# Patient Record
Sex: Female | Born: 1945 | Race: White | Hispanic: No | Smoking: Never smoker
Health system: Southern US, Community
[De-identification: ages and names within clinical notes are randomized; demographics above are authoritative.]

## PROBLEM LIST (undated history)

## (undated) DIAGNOSIS — M810 Age-related osteoporosis without current pathological fracture: Secondary | ICD-10-CM

## (undated) DIAGNOSIS — E785 Hyperlipidemia, unspecified: Secondary | ICD-10-CM

## (undated) DIAGNOSIS — J449 Chronic obstructive pulmonary disease, unspecified: Secondary | ICD-10-CM

## (undated) DIAGNOSIS — J309 Allergic rhinitis, unspecified: Secondary | ICD-10-CM

## (undated) DIAGNOSIS — A809 Acute poliomyelitis, unspecified: Secondary | ICD-10-CM

## (undated) DIAGNOSIS — K759 Inflammatory liver disease, unspecified: Secondary | ICD-10-CM

## (undated) DIAGNOSIS — J45909 Unspecified asthma, uncomplicated: Secondary | ICD-10-CM

## (undated) DIAGNOSIS — K5790 Diverticulosis of intestine, part unspecified, without perforation or abscess without bleeding: Secondary | ICD-10-CM

## (undated) DIAGNOSIS — B91 Sequelae of poliomyelitis: Secondary | ICD-10-CM

## (undated) DIAGNOSIS — I10 Essential (primary) hypertension: Secondary | ICD-10-CM

## (undated) DIAGNOSIS — J189 Pneumonia, unspecified organism: Secondary | ICD-10-CM

## (undated) DIAGNOSIS — L570 Actinic keratosis: Secondary | ICD-10-CM

## (undated) DIAGNOSIS — M1990 Unspecified osteoarthritis, unspecified site: Secondary | ICD-10-CM

## (undated) DIAGNOSIS — Z86010 Personal history of colonic polyps: Secondary | ICD-10-CM

## (undated) HISTORY — PX: NASAL SINUS SURGERY: SHX719

## (undated) HISTORY — DX: Actinic keratosis: L57.0

## (undated) HISTORY — PX: FRACTURE SURGERY: SHX138

## (undated) HISTORY — PX: ABDOMINAL HYSTERECTOMY: SHX81

## (undated) HISTORY — PX: COLONOSCOPY: SHX174

## (undated) HISTORY — PX: EYE SURGERY: SHX253

## (undated) HISTORY — PX: BONE CURRETTAGE W/ BONE GRAFT: SHX1248

---

## 1972-01-13 HISTORY — PX: OTHER SURGICAL HISTORY: SHX169

## 2003-11-22 ENCOUNTER — Ambulatory Visit: Payer: Self-pay | Admitting: Unknown Physician Specialty

## 2004-10-02 ENCOUNTER — Ambulatory Visit: Payer: Self-pay | Admitting: Internal Medicine

## 2005-10-05 ENCOUNTER — Ambulatory Visit: Payer: Self-pay | Admitting: Internal Medicine

## 2006-10-27 ENCOUNTER — Ambulatory Visit: Payer: Self-pay | Admitting: Internal Medicine

## 2007-07-20 ENCOUNTER — Ambulatory Visit: Payer: Self-pay | Admitting: Specialist

## 2007-10-31 ENCOUNTER — Ambulatory Visit: Payer: Self-pay | Admitting: Internal Medicine

## 2007-11-03 ENCOUNTER — Ambulatory Visit: Payer: Self-pay | Admitting: Unknown Physician Specialty

## 2008-07-18 ENCOUNTER — Ambulatory Visit: Payer: Self-pay | Admitting: Family Medicine

## 2008-11-12 ENCOUNTER — Ambulatory Visit: Payer: Self-pay | Admitting: Internal Medicine

## 2009-11-13 ENCOUNTER — Ambulatory Visit: Payer: Self-pay | Admitting: Internal Medicine

## 2010-11-17 ENCOUNTER — Ambulatory Visit: Payer: Self-pay | Admitting: Internal Medicine

## 2011-11-18 ENCOUNTER — Ambulatory Visit: Payer: Self-pay

## 2012-11-21 ENCOUNTER — Ambulatory Visit: Payer: Self-pay

## 2012-12-07 ENCOUNTER — Ambulatory Visit: Payer: Self-pay | Admitting: Unknown Physician Specialty

## 2012-12-09 LAB — PATHOLOGY REPORT

## 2013-04-24 ENCOUNTER — Encounter: Payer: Self-pay | Admitting: Nurse Practitioner

## 2013-05-12 ENCOUNTER — Encounter: Payer: Self-pay | Admitting: Nurse Practitioner

## 2013-12-06 ENCOUNTER — Ambulatory Visit: Payer: Self-pay

## 2014-11-22 ENCOUNTER — Other Ambulatory Visit: Payer: Self-pay | Admitting: Infectious Diseases

## 2014-11-22 DIAGNOSIS — Z1231 Encounter for screening mammogram for malignant neoplasm of breast: Secondary | ICD-10-CM

## 2014-12-11 ENCOUNTER — Other Ambulatory Visit: Payer: Self-pay | Admitting: Infectious Diseases

## 2014-12-11 ENCOUNTER — Ambulatory Visit
Admission: RE | Admit: 2014-12-11 | Discharge: 2014-12-11 | Disposition: A | Discharge status: Home / Self Care | Payer: Medicare Other | Source: Ambulatory Visit | Attending: Infectious Diseases | Admitting: Infectious Diseases

## 2014-12-11 DIAGNOSIS — Z1231 Encounter for screening mammogram for malignant neoplasm of breast: Secondary | ICD-10-CM

## 2015-11-21 ENCOUNTER — Other Ambulatory Visit: Payer: Self-pay | Admitting: Infectious Diseases

## 2015-11-21 DIAGNOSIS — Z1231 Encounter for screening mammogram for malignant neoplasm of breast: Secondary | ICD-10-CM

## 2015-12-25 ENCOUNTER — Ambulatory Visit
Admission: RE | Admit: 2015-12-25 | Discharge: 2015-12-25 | Disposition: A | Discharge status: Home / Self Care | Payer: Medicare Other | Source: Ambulatory Visit | Attending: Infectious Diseases | Admitting: Infectious Diseases

## 2015-12-25 DIAGNOSIS — Z1231 Encounter for screening mammogram for malignant neoplasm of breast: Secondary | ICD-10-CM | POA: Diagnosis present

## 2017-02-03 ENCOUNTER — Other Ambulatory Visit: Payer: Self-pay | Admitting: Infectious Diseases

## 2017-02-03 DIAGNOSIS — Z1231 Encounter for screening mammogram for malignant neoplasm of breast: Secondary | ICD-10-CM

## 2017-02-25 ENCOUNTER — Ambulatory Visit
Admission: RE | Admit: 2017-02-25 | Discharge: 2017-02-25 | Disposition: A | Discharge status: Home / Self Care | Payer: Medicare Other | Source: Ambulatory Visit | Attending: Infectious Diseases | Admitting: Infectious Diseases

## 2017-02-25 DIAGNOSIS — Z1231 Encounter for screening mammogram for malignant neoplasm of breast: Secondary | ICD-10-CM | POA: Diagnosis present

## 2018-11-08 ENCOUNTER — Other Ambulatory Visit: Payer: Self-pay | Admitting: Internal Medicine

## 2018-11-08 DIAGNOSIS — Z1231 Encounter for screening mammogram for malignant neoplasm of breast: Secondary | ICD-10-CM

## 2019-01-26 ENCOUNTER — Ambulatory Visit
Admission: RE | Admit: 2019-01-26 | Discharge: 2019-01-26 | Disposition: A | Discharge status: Home / Self Care | Payer: Medicare Other | Source: Ambulatory Visit | Attending: Internal Medicine | Admitting: Internal Medicine

## 2019-01-26 DIAGNOSIS — Z1231 Encounter for screening mammogram for malignant neoplasm of breast: Secondary | ICD-10-CM | POA: Diagnosis present

## 2019-03-23 ENCOUNTER — Other Ambulatory Visit
Admission: RE | Admit: 2019-03-23 | Discharge: 2019-03-23 | Disposition: A | Discharge status: Home / Self Care | Payer: Medicare Other | Source: Ambulatory Visit | Attending: Internal Medicine | Admitting: Internal Medicine

## 2019-03-23 ENCOUNTER — Other Ambulatory Visit: Payer: Self-pay

## 2019-03-23 DIAGNOSIS — Z20822 Contact with and (suspected) exposure to covid-19: Secondary | ICD-10-CM | POA: Diagnosis not present

## 2019-03-23 DIAGNOSIS — Z01812 Encounter for preprocedural laboratory examination: Secondary | ICD-10-CM | POA: Insufficient documentation

## 2019-03-23 LAB — SARS CORONAVIRUS 2 (TAT 6-24 HRS): SARS Coronavirus 2: NEGATIVE

## 2019-03-24 ENCOUNTER — Encounter: Payer: Self-pay | Admitting: Internal Medicine

## 2019-03-27 ENCOUNTER — Encounter: Payer: Self-pay | Admitting: Internal Medicine

## 2019-03-27 ENCOUNTER — Ambulatory Visit
Admission: RE | Admit: 2019-03-27 | Discharge: 2019-03-27 | Disposition: A | Discharge status: Home / Self Care | Payer: Medicare Other | Attending: Internal Medicine | Admitting: Internal Medicine

## 2019-03-27 ENCOUNTER — Ambulatory Visit: Payer: Medicare Other | Admitting: Certified Registered"

## 2019-03-27 ENCOUNTER — Encounter
Admission: RE | Disposition: A | Discharge status: Home / Self Care | Payer: Self-pay | Source: Home / Self Care | Attending: Internal Medicine

## 2019-03-27 DIAGNOSIS — Z79899 Other long term (current) drug therapy: Secondary | ICD-10-CM | POA: Insufficient documentation

## 2019-03-27 DIAGNOSIS — M1990 Unspecified osteoarthritis, unspecified site: Secondary | ICD-10-CM | POA: Diagnosis not present

## 2019-03-27 DIAGNOSIS — D122 Benign neoplasm of ascending colon: Secondary | ICD-10-CM | POA: Diagnosis not present

## 2019-03-27 DIAGNOSIS — M810 Age-related osteoporosis without current pathological fracture: Secondary | ICD-10-CM | POA: Diagnosis not present

## 2019-03-27 DIAGNOSIS — Z7982 Long term (current) use of aspirin: Secondary | ICD-10-CM | POA: Insufficient documentation

## 2019-03-27 DIAGNOSIS — K640 First degree hemorrhoids: Secondary | ICD-10-CM | POA: Diagnosis not present

## 2019-03-27 DIAGNOSIS — J449 Chronic obstructive pulmonary disease, unspecified: Secondary | ICD-10-CM | POA: Insufficient documentation

## 2019-03-27 DIAGNOSIS — I10 Essential (primary) hypertension: Secondary | ICD-10-CM | POA: Diagnosis not present

## 2019-03-27 DIAGNOSIS — E785 Hyperlipidemia, unspecified: Secondary | ICD-10-CM | POA: Insufficient documentation

## 2019-03-27 DIAGNOSIS — Z1211 Encounter for screening for malignant neoplasm of colon: Secondary | ICD-10-CM | POA: Insufficient documentation

## 2019-03-27 DIAGNOSIS — Z7951 Long term (current) use of inhaled steroids: Secondary | ICD-10-CM | POA: Diagnosis not present

## 2019-03-27 DIAGNOSIS — Z8612 Personal history of poliomyelitis: Secondary | ICD-10-CM | POA: Diagnosis not present

## 2019-03-27 DIAGNOSIS — K5730 Diverticulosis of large intestine without perforation or abscess without bleeding: Secondary | ICD-10-CM | POA: Insufficient documentation

## 2019-03-27 DIAGNOSIS — Z86010 Personal history of colonic polyps: Secondary | ICD-10-CM | POA: Insufficient documentation

## 2019-03-27 HISTORY — DX: Personal history of colonic polyps: Z86.010

## 2019-03-27 HISTORY — DX: Acute poliomyelitis, unspecified: A80.9

## 2019-03-27 HISTORY — DX: Sequelae of poliomyelitis: B91

## 2019-03-27 HISTORY — DX: Allergic rhinitis, unspecified: J30.9

## 2019-03-27 HISTORY — DX: Pneumonia, unspecified organism: J18.9

## 2019-03-27 HISTORY — DX: Essential (primary) hypertension: I10

## 2019-03-27 HISTORY — DX: Inflammatory liver disease, unspecified: K75.9

## 2019-03-27 HISTORY — DX: Unspecified asthma, uncomplicated: J45.909

## 2019-03-27 HISTORY — PX: COLONOSCOPY WITH PROPOFOL: SHX5780

## 2019-03-27 HISTORY — DX: Hyperlipidemia, unspecified: E78.5

## 2019-03-27 HISTORY — DX: Chronic obstructive pulmonary disease, unspecified: J44.9

## 2019-03-27 HISTORY — DX: Diverticulosis of intestine, part unspecified, without perforation or abscess without bleeding: K57.90

## 2019-03-27 HISTORY — DX: Age-related osteoporosis without current pathological fracture: M81.0

## 2019-03-27 HISTORY — DX: Unspecified osteoarthritis, unspecified site: M19.90

## 2019-03-27 SURGERY — COLONOSCOPY WITH PROPOFOL
Anesthesia: General

## 2019-03-27 MED ORDER — PROPOFOL 10 MG/ML IV BOLUS
INTRAVENOUS | Status: AC
  Filled 2019-03-27: qty 20

## 2019-03-27 MED ORDER — SODIUM CHLORIDE (PF) 0.9 % IJ SOLN
INTRAMUSCULAR | Status: DC | PRN
  Administered 2019-03-27: 5 mL

## 2019-03-27 MED ORDER — SODIUM CHLORIDE 0.9 % IV SOLN: INTRAVENOUS | Status: DC

## 2019-03-27 MED ORDER — LIDOCAINE HCL (CARDIAC) PF 100 MG/5ML IV SOSY
PREFILLED_SYRINGE | INTRAVENOUS | Status: DC | PRN
  Administered 2019-03-27: 40 mg via INTRAVENOUS

## 2019-03-27 MED ORDER — PROPOFOL 500 MG/50ML IV EMUL
INTRAVENOUS | Status: DC | PRN
  Administered 2019-03-27: 70 mg via INTRAVENOUS
  Administered 2019-03-27: 125 ug/kg/min via INTRAVENOUS

## 2019-03-27 NOTE — Op Note (Signed)
[facility] Gastroenterology Patient Name: Cheyenne Johnson Procedure Date: 03/27/2019 12:53 PM MRN: [id number] Account #: [id number] Date of Birth: 1945-05-31 Admit Type: Outpatient Age: 74 Room: [facility] ENDO ROOM 3 Gender: Female Note Status: Finalized Procedure:             Colonoscopy Indications:           Surveillance: Personal history of adenomatous polyps                         on last colonoscopy 5 years ago Providers:             Lorie Apley K. Alice Reichert MD, MD Referring MD:          Ramonita Lab, MD (Referring MD) Medicines:             Propofol per Anesthesia Complications:         No immediate complications. Procedure:             Pre-Anesthesia Assessment:                        - The risks and benefits of the procedure and the                         sedation options and risks were discussed with the                         patient. All questions were answered and informed                         consent was obtained.                        - Patient identification and proposed procedure were                         verified prior to the procedure by the nurse. The                         procedure was verified in the procedure room.                        - ASA Grade Assessment: III - A patient with severe                         systemic disease.                        - After reviewing the risks and benefits, the patient                         was deemed in satisfactory condition to undergo the                         procedure.                        After obtaining informed consent, the colonoscope was                         passed under direct vision. Throughout  the procedure,                         the patient's blood pressure, pulse, and oxygen                         saturations were monitored continuously. The                         Colonoscope was introduced through the anus and                         advanced to the the cecum, identified by  appendiceal                         orifice and ileocecal valve. The colonoscopy was                         performed without difficulty. The patient tolerated                         the procedure well. The quality of the bowel                         preparation was good. The ileocecal valve, appendiceal                         orifice, and rectum were photographed. Findings:      The perianal and digital rectal examinations were normal. Pertinent       negatives include normal sphincter tone and no palpable rectal lesions.      Many small-mouthed diverticula were found in the sigmoid colon.      A 12 mm polyp was found in the ascending colon. The polyp was       carpet-like. The polyp was removed with a saline injection-lift       technique using a hot snare. Resection and retrieval were complete. To       prevent bleeding after the polypectomy, one hemostatic clip was       successfully placed (MR conditional). There was no bleeding during, or       at the end, of the procedure.      Non-bleeding internal hemorrhoids were found during retroflexion. The       hemorrhoids were Grade I (internal hemorrhoids that do not prolapse).      The exam was otherwise without abnormality. Impression:            - Diverticulosis in the sigmoid colon.                        - One 12 mm polyp in the ascending colon, removed                         using injection-lift and a hot snare. Resected and                         retrieved. Clip (MR conditional) was placed.                        - Non-bleeding internal hemorrhoids.                        -  The examination was otherwise normal. Recommendation:        - Patient has a contact number available for                         emergencies. The signs and symptoms of potential                         delayed complications were discussed with the patient.                         Return to normal activities tomorrow. Written                          discharge instructions were provided to the patient.                        - Resume previous diet.                        - Continue present medications.                        - Await pathology results.                        - If polyps are benign or adenomatous without                         dysplasia, I will advise NO further colonoscopy due to                         advanced age and/or severe comorbidity.                        - Return to GI office PRN.                        - The findings and recommendations were discussed with                         the patient. Procedure Code(s):     --- Professional ---                        [phone], Colonoscopy, flexible; with removal of                         tumor(s), polyp(s), or other lesion(s) by snare                         technique                        [id number], Colonoscopy, flexible; with directed submucosal                         injection(s), any substance Diagnosis Code(s):     --- Professional ---                        K57.30, Diverticulosis of large intestine without  perforation or abscess without bleeding                        K63.5, Polyp of colon                        K64.0, First degree hemorrhoids                        Z86.010, Personal history of colonic polyps CPT copyright 2019 [employer]. All rights reserved. The codes documented in this report are preliminary and upon coder review may  be revised to meet current compliance requirements. Efrain Sella MD, MD 03/27/2019 1:25:37 PM This report has been signed electronically. Number of Addenda: 0 Note Initiated On: 03/27/2019 12:53 PM Scope Withdrawal Time: 0 hours 17 minutes 22 seconds  Total Procedure Duration: 0 hours 22 minutes 20 seconds  Estimated Blood Loss:  Estimated blood loss: none.      [facility]

## 2019-03-27 NOTE — Anesthesia Postprocedure Evaluation (Signed)
Anesthesia Post Note  Patient: Cheyenne Johnson  Procedure(s) Performed: COLONOSCOPY WITH PROPOFOL (N/A )  Patient location during evaluation: Endoscopy Anesthesia Type: General Level of consciousness: awake and alert and oriented Pain management: pain level controlled Vital Signs Assessment: post-procedure vital signs reviewed and stable Respiratory status: spontaneous breathing, nonlabored ventilation and respiratory function stable Cardiovascular status: blood pressure returned to baseline and stable Postop Assessment: no signs of nausea or vomiting Anesthetic complications: no     Last Vitals:  Vitals:   03/27/19 1209 03/27/19 1323  BP: (!) 136/48   Pulse: 73   Resp: 16   Temp: 36.8 C (!) 35.8 C  SpO2: 96%     Last Pain:  Vitals:   03/27/19 1353  TempSrc:   PainSc: 0-No pain                 Kekai Geter

## 2019-03-27 NOTE — H&P (Signed)
Outpatient short stay form Pre-procedure 03/27/2019 11:48 AM Cheyenne Johnson K. Cheyenne Johnson, M.D.  Primary Physician: Adrian Prows, M.D.  Reason for visit:  Past hx of sigmoid colon adenoma, 2014.  History of present illness:                            Patient presents for colonoscopy for a personal hx of colon polyps. The patient denies abdominal pain, abnormal weight loss or rectal bleeding.     No current facility-administered medications for this encounter.  Current Outpatient Medications:  .  acetaminophen (TYLENOL) 500 MG tablet, Take 500 mg by mouth every 6 (six) hours as needed., Disp: , Rfl:  .  albuterol (VENTOLIN HFA) 108 (90 Base) MCG/ACT inhaler, Inhale 2 puffs into the lungs every 6 (six) hours as needed for wheezing or shortness of breath., Disp: , Rfl:  .  amLODipine-benazepril (LOTREL) 5-10 MG capsule, Take 1 capsule by mouth daily., Disp: , Rfl:  .  aspirin EC 81 MG tablet, Take 81 mg by mouth daily., Disp: , Rfl:  .  Calcium-Phosphorus-Vitamin D (CITRACAL +D3) 250-107-500 MG-MG-UNIT CHEW, Chew 1 tablet by mouth daily., Disp: , Rfl:  .  fluticasone (FLONASE) 50 MCG/ACT nasal spray, Place 2 sprays into both nostrils daily., Disp: , Rfl:  .  Fluticasone-Salmeterol (ADVAIR) 100-50 MCG/DOSE AEPB, Inhale 1 puff into the lungs 2 (two) times daily., Disp: , Rfl:  .  levocetirizine (XYZAL) 5 MG tablet, Take 5 mg by mouth every evening., Disp: , Rfl:  .  lovastatin (MEVACOR) 20 MG tablet, Take 20 mg by mouth at bedtime., Disp: , Rfl:  .  montelukast (SINGULAIR) 10 MG tablet, Take 10 mg by mouth at bedtime., Disp: , Rfl:  .  Polyethyl Glycol-Propyl Glycol (SYSTANE ULTRA) 0.4-0.3 % SOLN, Apply 1 drop to eye daily., Disp: , Rfl:   No medications prior to admission.     Not on File   Past Medical History:  Diagnosis Date  . Allergic rhinitis   . Arthritis   . Asthma   . COPD (chronic obstructive pulmonary disease) ([place])   . Diverticulosis   . Hepatitis   . Hx of adenomatous  colonic polyps   . Hyperlipidemia   . Hypertension   . Osteoporosis, postmenopausal   . Pneumonia   . Poliomyelitis   . Post-polio muscle weakness     Review of systems:  Otherwise negative.    Physical Exam  Gen: Alert, oriented. Appears stated age.  HEENT: [place]/AT. PERRLA. Lungs: CTA, no wheezes. CV: RR nl S1, S2. Abd: soft, benign, no masses. BS+ Ext: No edema. Pulses 2+    Planned procedures: Proceed with colonoscopy. The patient understands the nature of the planned procedure, indications, risks, alternatives and potential complications including but not limited to bleeding, infection, perforation, damage to internal organs and possible oversedation/side effects from anesthesia. The patient agrees and gives consent to proceed.  Please refer to procedure notes for findings, recommendations and patient disposition/instructions.     [name] K. Cheyenne Johnson, M.D. Gastroenterology 03/27/2019  11:48 AM

## 2019-03-27 NOTE — Transfer of Care (Signed)
Immediate Anesthesia Transfer of Care Note  Patient: Cheyenne Johnson  Procedure(s) Performed: COLONOSCOPY WITH PROPOFOL (N/A )  Patient Location: PACU  Anesthesia Type:MAC  Level of Consciousness: awake  Airway & Oxygen Therapy: Patient Spontanous Breathing  Post-op Assessment: Report given to RN  Post vital signs: stable  Last Vitals:  Vitals Value Taken Time  BP 120/64 03/27/19 1324  Temp 35.8 C 03/27/19 1323  Pulse 79 03/27/19 1324  Resp 18 03/27/19 1324  SpO2 100 % 03/27/19 1324  Vitals shown include unvalidated device data.  Last Pain:  Vitals:   03/27/19 1323  TempSrc: Temporal  PainSc:          Complications: No apparent anesthesia complications

## 2019-03-27 NOTE — Anesthesia Preprocedure Evaluation (Signed)
Anesthesia Evaluation  Patient identified by MRN, date of birth, ID band Patient awake    Reviewed: Allergy & Precautions, NPO status , Patient's Chart, lab work & pertinent test results  History of Anesthesia Complications Negative for: history of anesthetic complications  Airway Mallampati: II  TM Distance: >3 FB Neck ROM: Full    Dental no notable dental hx.    Pulmonary asthma , COPD,  COPD inhaler,    breath sounds clear to auscultation- rhonchi (-) wheezing      Cardiovascular hypertension, Pt. on medications (-) CAD, (-) Past MI, (-) Cardiac Stents and (-) CABG  Rhythm:Regular Rate:Normal - Systolic murmurs and - Diastolic murmurs    Neuro/Psych neg Seizures negative neurological ROS  negative psych ROS   GI/Hepatic negative GI ROS, Neg liver ROS,   Endo/Other  negative endocrine ROSneg diabetes  Renal/GU negative Renal ROS     Musculoskeletal  (+) Arthritis ,   Abdominal (+) + obese,   Peds  Hematology negative hematology ROS (+)   Anesthesia Other Findings Past Medical History: No date: Allergic rhinitis No date: Arthritis No date: Asthma No date: COPD (chronic obstructive pulmonary disease) (H[facility]) No date: Diverticulosis No date: Hepatitis No date: Hx of adenomatous colonic polyps No date: Hyperlipidemia No date: Hypertension No date: Osteoporosis, postmenopausal No date: Pneumonia No date: Poliomyelitis No date: Post-polio muscle weakness   Reproductive/Obstetrics                             Anesthesia Physical Anesthesia Plan  ASA: II  Anesthesia Plan: General   Post-op Pain Management:    Induction: Intravenous  PONV Risk Score and Plan: 2 and Propofol infusion  Airway Management Planned: Natural Airway  Additional Equipment:   Intra-op Plan:   Post-operative Plan:   Informed Consent: I have reviewed the patients History and Physical, chart, labs and  discussed the procedure including the risks, benefits and alternatives for the proposed anesthesia with the patient or authorized representative who has indicated his/her understanding and acceptance.     Dental advisory given  Plan Discussed with: CRNA and Anesthesiologist  Anesthesia Plan Comments:         Anesthesia Quick Evaluation

## 2019-03-27 NOTE — Interval H&P Note (Signed)
History and Physical Interval Note:  03/27/2019 11:56 AM  Cheyenne Johnson  has presented today for surgery, with the diagnosis of PH POLYPS.  The various methods of treatment have been discussed with the patient and family. After consideration of risks, benefits and other options for treatment, the patient has consented to  Procedure(s): COLONOSCOPY WITH PROPOFOL (N/A) as a surgical intervention.  The patient's history has been reviewed, patient examined, no change in status, stable for surgery.  I have reviewed the patient's chart and labs.  Questions were answered to the patient's satisfaction.     [place], Edie

## 2019-03-27 NOTE — Interval H&P Note (Signed)
History and Physical Interval Note:  03/27/2019 12:44 PM  Cheyenne Johnson  has presented today for surgery, with the diagnosis of [place].  The various methods of treatment have been discussed with the patient and family. After consideration of risks, benefits and other options for treatment, the patient has consented to  Procedure(s): COLONOSCOPY WITH PROPOFOL (N/A) as a surgical intervention.  The patient's history has been reviewed, patient examined, no change in status, stable for surgery.  I have reviewed the patient's chart and labs.  Questions were answered to the patient's satisfaction.     [place], [place]

## 2019-03-28 ENCOUNTER — Encounter: Payer: Self-pay | Admitting: *Deleted

## 2019-03-28 LAB — SURGICAL PATHOLOGY

## 2019-08-29 ENCOUNTER — Other Ambulatory Visit: Payer: Self-pay

## 2019-08-29 ENCOUNTER — Ambulatory Visit (INDEPENDENT_AMBULATORY_CARE_PROVIDER_SITE_OTHER): Payer: Medicare Other | Admitting: Dermatology

## 2019-08-29 DIAGNOSIS — Z1283 Encounter for screening for malignant neoplasm of skin: Secondary | ICD-10-CM | POA: Diagnosis not present

## 2019-08-29 DIAGNOSIS — W57XXXA Bitten or stung by nonvenomous insect and other nonvenomous arthropods, initial encounter: Secondary | ICD-10-CM

## 2019-08-29 DIAGNOSIS — S80862A Insect bite (nonvenomous), left lower leg, initial encounter: Secondary | ICD-10-CM

## 2019-08-29 DIAGNOSIS — D229 Melanocytic nevi, unspecified: Secondary | ICD-10-CM

## 2019-08-29 DIAGNOSIS — D3610 Benign neoplasm of peripheral nerves and autonomic nervous system, unspecified: Secondary | ICD-10-CM

## 2019-08-29 DIAGNOSIS — L578 Other skin changes due to chronic exposure to nonionizing radiation: Secondary | ICD-10-CM

## 2019-08-29 DIAGNOSIS — S80861A Insect bite (nonvenomous), right lower leg, initial encounter: Secondary | ICD-10-CM

## 2019-08-29 DIAGNOSIS — D3612 Benign neoplasm of peripheral nerves and autonomic nervous system, upper limb, including shoulder: Secondary | ICD-10-CM | POA: Diagnosis not present

## 2019-08-29 DIAGNOSIS — D1800 Hemangioma unspecified site: Secondary | ICD-10-CM

## 2019-08-29 DIAGNOSIS — L820 Inflamed seborrheic keratosis: Secondary | ICD-10-CM

## 2019-08-29 DIAGNOSIS — L821 Other seborrheic keratosis: Secondary | ICD-10-CM

## 2019-08-29 MED ORDER — TRIAMCINOLONE ACETONIDE 0.1 % EX CREA: TOPICAL_CREAM | CUTANEOUS | 1 refills | Status: DC

## 2019-08-29 NOTE — Patient Instructions (Addendum)
Cryotherapy Aftercare   Wash gently with soap and water everyday.    Apply Vaseline and Band-Aid daily until healed.  Melanoma ABCDEs  Melanoma is the most dangerous type of skin cancer, and is the leading cause of death from skin disease.  You are more likely to develop melanoma if you:  Have light-colored skin, light-colored eyes, or red or blond hair  Spend a lot of time in the sun  Tan regularly, either outdoors or in a tanning bed  Have had blistering sunburns, especially during childhood  Have a close family member who has had a melanoma  Have atypical moles or large birthmarks  Early detection of melanoma is key since treatment is typically straightforward and cure rates are extremely high if we catch it early.   The first sign of melanoma is often a change in a mole or a new dark spot.  The ABCDE system is a way of remembering the signs of melanoma.  A for asymmetry:  The two halves do not match. B for border:  The edges of the growth are irregular. C for color:  A mixture of colors are present instead of an even brown color. D for diameter:  Melanomas are usually (but not always) greater than 67mm - the size of a pencil eraser. E for evolution:  The spot keeps changing in size, shape, and color.  Please check your skin once per month between visits. You can use a small mirror in front and a large mirror behind you to keep an eye on the back side or your body.   If you see any new or changing lesions before your next follow-up, please call to schedule a visit.  Please continue daily skin protection including broad spectrum sunscreen SPF 30+ to sun-exposed areas, reapplying every 2 hours as needed when you're outdoors.   Seborrheic Keratosis  What causes seborrheic keratoses? Seborrheic keratoses are harmless, common skin growths that first appear during adult life.  As time goes by, more growths appear.  Some people may develop a large number of them.  Seborrheic  keratoses appear on both covered and uncovered body parts.  They are not caused by sunlight.  The tendency to develop seborrheic keratoses can be inherited.  They vary in color from skin-colored to gray, brown, or even black.  They can be either smooth or have a rough, warty surface.   Seborrheic keratoses are superficial and look as if they were stuck on the skin.  Under the microscope this type of keratosis looks like layers upon layers of skin.  That is why at times the top layer may seem to fall off, but the rest of the growth remains and re-grows.    Treatment Seborrheic keratoses do not need to be treated, but can easily be removed in the office.  Seborrheic keratoses often cause symptoms when they rub on clothing or jewelry.  Lesions can be in the way of shaving.  If they become inflamed, they can cause itching, soreness, or burning.  Removal of a seborrheic keratosis can be accomplished by freezing, burning, or surgery. If any spot bleeds, scabs, or grows rapidly, please return to have it checked, as these can be an indication of a skin cancer.  Topical steroids (such as triamcinolone, fluocinolone, fluocinonide, mometasone, clobetasol, halobetasol, betamethasone, hydrocortisone) can cause thinning and lightening of the skin if they are used for too long in the same area. Your physician has selected the right strength medicine for your problem and area  affected on the body. Please use your medication only as directed by your physician to prevent side effects.

## 2019-08-29 NOTE — Progress Notes (Signed)
New Patient Visit  Subjective  Cheyenne Johnson is a 74 y.o. female who presents for the following: Annual Exam (Patient here today for TBSE. No history of skin cancer. ).  She has some spots that get itchy and crusty.  She also has itchy bug bites on the legs.    The following portions of the chart were reviewed this encounter and updated as appropriate:      Review of Systems:  No other skin or systemic complaints except as noted in HPI or Assessment and Plan.  Objective  Well appearing patient in no apparent distress; mood and affect are within normal limits.  A full examination was performed including scalp, head, eyes, ears, nose, lips, neck, chest, axillae, abdomen, back, buttocks, bilateral upper extremities, bilateral lower extremities, hands, feet, fingers, toes, fingernails, and toenails. All findings within normal limits unless otherwise noted below.  Objective  L ant neck, L chest, L lower back, R chest (4): Erythematous keratotic or waxy stuck-on papule   Objective  Left Thenar Hand: Soft flesh papule  Objective  Legs: Pink excoriated papules   Assessment & Plan  Inflamed seborrheic keratosis (4) L ant neck, L chest, L lower back, R chest  Destruction of lesion - L ant neck, L chest, L lower back, R chest  Destruction method: cryotherapy   Destruction method comment:  Electrodessication Informed consent: discussed and consent obtained   Lesion destroyed using liquid nitrogen: Yes   Region frozen until ice ball extended beyond lesion: Yes   Outcome: patient tolerated procedure well with no complications   Post-procedure details: wound care instructions given   Additional details:  Prior to procedure, discussed risks of blister formation, small wound, skin dyspigmentation, or rare scar following cryotherapy.  Neurofibroma Left Thenar Hand  Benign-appearing.  Observation.  Call clinic for new or changing moles.  Recommend daily use of broad spectrum spf 30+  sunscreen to sun-exposed areas.    Insect bite of multiple sites with local reaction Legs  TMC 0.1% cream to spot treat affected areas 1-2 times daily as needed for itch.  Topical steroids (such as triamcinolone, fluocinolone, fluocinonide, mometasone, clobetasol, halobetasol, betamethasone, hydrocortisone) can cause thinning and lightening of the skin if they are used for too long in the same area. Your physician has selected the right strength medicine for your problem and area affected on the body. Please use your medication only as directed by your physician to prevent side effects.    triamcinolone cream (KENALOG) 0.1 % - Legs    Seborrheic Keratoses - Stuck-on, waxy, tan-brown papules and plaques left infraocular - Discussed benign etiology and prognosis. - Observe - Call for any changes  Melanocytic Nevi - Tan-brown and/or pink-flesh-colored symmetric macules and papules - Benign appearing on exam today - Observation - Call clinic for new or changing moles - Recommend daily use of broad spectrum spf 30+ sunscreen to sun-exposed areas.   Hemangiomas - Red papules - Discussed benign nature - Observe - Call for any changes  Actinic Damage - diffuse scaly erythematous macules with underlying dyspigmentation - Recommend daily broad spectrum sunscreen SPF 30+ to sun-exposed areas, reapply every 2 hours as needed.  - Call for new or changing lesions.  Skin cancer screening performed today.   Return if symptoms worsen or fail to improve.  Graciella Belton, RMA, am acting as scribe for Brendolyn Patty, MD . Documentation: I have reviewed the above documentation for accuracy and completeness, and I agree with the above.  Baxter Flattery  Nicole Kindred MD

## 2019-08-30 ENCOUNTER — Other Ambulatory Visit: Payer: Self-pay

## 2019-08-30 DIAGNOSIS — W57XXXA Bitten or stung by nonvenomous insect and other nonvenomous arthropods, initial encounter: Secondary | ICD-10-CM

## 2019-08-30 MED ORDER — TRIAMCINOLONE ACETONIDE 0.1 % EX CREA: TOPICAL_CREAM | CUTANEOUS | 1 refills | Status: DC

## 2019-08-30 NOTE — Progress Notes (Signed)
Patient's pharmacy states they never received medication.

## 2019-09-04 NOTE — Addendum Note (Signed)
Addended by: Brendolyn Patty on: 09/04/2019 10:03 AM   Modules accepted: Level of Service

## 2019-12-21 ENCOUNTER — Other Ambulatory Visit: Payer: Self-pay | Admitting: Internal Medicine

## 2019-12-21 DIAGNOSIS — Z1231 Encounter for screening mammogram for malignant neoplasm of breast: Secondary | ICD-10-CM

## 2020-02-29 ENCOUNTER — Ambulatory Visit
Admission: RE | Admit: 2020-02-29 | Discharge: 2020-02-29 | Disposition: A | Discharge status: Home / Self Care | Payer: Medicare Other | Source: Ambulatory Visit | Attending: Internal Medicine | Admitting: Internal Medicine

## 2020-02-29 ENCOUNTER — Other Ambulatory Visit: Payer: Self-pay

## 2020-02-29 DIAGNOSIS — Z1231 Encounter for screening mammogram for malignant neoplasm of breast: Secondary | ICD-10-CM | POA: Diagnosis present

## 2021-01-22 ENCOUNTER — Other Ambulatory Visit: Payer: Self-pay | Admitting: Internal Medicine

## 2021-01-22 DIAGNOSIS — Z1231 Encounter for screening mammogram for malignant neoplasm of breast: Secondary | ICD-10-CM

## 2021-03-03 ENCOUNTER — Ambulatory Visit
Admission: RE | Admit: 2021-03-03 | Discharge: 2021-03-03 | Disposition: A | Discharge status: Home / Self Care | Payer: Medicare Other | Source: Ambulatory Visit | Attending: Internal Medicine | Admitting: Internal Medicine

## 2021-03-03 ENCOUNTER — Other Ambulatory Visit: Payer: Self-pay

## 2021-03-03 DIAGNOSIS — Z1231 Encounter for screening mammogram for malignant neoplasm of breast: Secondary | ICD-10-CM | POA: Insufficient documentation

## 2021-08-11 ENCOUNTER — Emergency Department
Admission: EM | Admit: 2021-08-11 | Discharge: 2021-08-11 | Disposition: A | Discharge status: Home / Self Care | Payer: Medicare Other | Attending: Emergency Medicine | Admitting: Emergency Medicine

## 2021-08-11 ENCOUNTER — Other Ambulatory Visit: Payer: Self-pay

## 2021-08-11 ENCOUNTER — Emergency Department: Payer: Medicare Other

## 2021-08-11 DIAGNOSIS — J449 Chronic obstructive pulmonary disease, unspecified: Secondary | ICD-10-CM | POA: Diagnosis not present

## 2021-08-11 DIAGNOSIS — S8992XA Unspecified injury of left lower leg, initial encounter: Secondary | ICD-10-CM | POA: Diagnosis present

## 2021-08-11 DIAGNOSIS — I10 Essential (primary) hypertension: Secondary | ICD-10-CM | POA: Insufficient documentation

## 2021-08-11 DIAGNOSIS — X501XXA Overexertion from prolonged static or awkward postures, initial encounter: Secondary | ICD-10-CM | POA: Insufficient documentation

## 2021-08-11 DIAGNOSIS — S82202A Unspecified fracture of shaft of left tibia, initial encounter for closed fracture: Secondary | ICD-10-CM | POA: Insufficient documentation

## 2021-08-11 MED ORDER — ONDANSETRON 4 MG PO TBDP: 4.0000 mg | ORAL_TABLET | Freq: Four times a day (QID) | ORAL | 0 refills | Status: AC | PRN

## 2021-08-11 MED ORDER — FENTANYL CITRATE PF 50 MCG/ML IJ SOSY
50.0000 ug | PREFILLED_SYRINGE | Freq: Once | INTRAMUSCULAR | Status: AC
  Administered 2021-08-11: 50 ug via INTRAVENOUS
  Filled 2021-08-11: qty 1

## 2021-08-11 MED ORDER — HYDROCODONE-ACETAMINOPHEN 5-325 MG PO TABS: 1.0000 | ORAL_TABLET | Freq: Four times a day (QID) | ORAL | 0 refills | Status: DC | PRN

## 2021-08-11 NOTE — ED Provider Notes (Signed)
[facility] Provider Note    Event Date/Time   First MD Initiated Contact with Patient 08/11/21 1209     (approximate)   History   Leg Injury   HPI  LATTIE RIEGE is a 76 y.o. female history of COPD, hypertension, previous polio with chronic weakness lower extremities including inability to flex at the left knee.  She has had multiple orthopedic surgeries involving the left foot  Today she was putting things away in her home, she twisted and was unable to regain standing position because she cannot extend her left knee due to previous polio.  She fell to the ground falling directly onto her left shin.  She reports no head injury she did not fall or strike her chest neck or head.  She fell onto her knee but is tender and painful.  She denies pain in the left foot and reports chronic deformity and uses the special orthotics because of the chronic issues with the foot denies foot pain.  No numbness or weakness in the leg or muscles except she cannot extend at the left knee but reports that to be chronic.  She reports bruising and pain mostly in the middle of her shin and a little bit below her knee joint.  Denies injury to the pelvis or hips or any injury to her other extremities.  Pain was severe but reduced to about a 3 out of 10 by the medicine paramedics gave     Physical Exam   Triage Vital Signs: ED Triage Vitals  Enc Vitals Group     BP 08/11/21 1213 129/65     Pulse Rate 08/11/21 1213 76     Resp 08/11/21 1213 16     Temp 08/11/21 1213 98.2 F (36.8 C)     Temp Source 08/11/21 1213 Oral     SpO2 08/11/21 1213 94 %     Weight 08/11/21 1215 175 lb 14.8 oz (79.8 kg)     Height 08/11/21 1215 '5\' 2"'$  (1.575 m)     Head Circumference --      Peak Flow --      Pain Score 08/11/21 1215 5     Pain Loc --      Pain Edu? --      Excl. in [place]? --     Most recent vital signs: Vitals:   08/11/21 1330 08/11/21 1400  BP: (!) 108/58 (!) 127/58  Pulse: (!) 58  76  Resp:    Temp:    SpO2: 97% 95%     General: Awake, no distress.  Very pleasant.  Daughter at bedside very pleasant CV:  Good peripheral perfusion.  Strong easily dopplerable pulses involving the posterior tibial and dorsalis pedis pulses of the left foot.  Some slight edema or scar tissue overlying the left posterior tibial she reports chronic due to previous surgeries.  Warm well-perfused digits all toes. Speaks clearly without distress speaks clearly no distress Resp:  Normal effort.  Abd:  No distention.  Other:  No tenderness across the pelvis.  No abdominal pain or discomfort.  Excellent use of the upper extremities bilaterally.  Normal range of motion of the right lower extremity with exception to slight weakness with and about the right knee which she reports to be chronic from polio.  No deformities.  No bony tenderness involving any extremity except for the left lower extremity.  No tenderness over the trochanters of the left leg, no tenderness over the left hip  femur or knee.  There is some chronic swelling of the left knee which patient reports is from previous surgeries denies knee pain.  She has bruising contusion and suspect small hematoma formation notable over the anterior left tib-fib proximal one third.  Denies tenderness to palpation of the ankle and left foot.  Chronic apparent deformity of the left foot.  Warm well-perfused toes.  Normal sensation across the foot as well as good ability to dorsi and plantarflex left foot.  Compartments and calf soft, no compartmental swelling.   ED Results / Procedures / Treatments   Labs (all labs ordered are listed, but only abnormal results are displayed) Labs Reviewed - No data to display   EKG     RADIOLOGY  Personally viewed the patient's images, notable for tibial fracture with mild angulation  DG Foot Complete Left  Result Date: 08/11/2021 CLINICAL DATA:  Left foot pain after fall. EXAM: LEFT FOOT - COMPLETE 3+  VIEW COMPARISON:  None Available. FINDINGS: There is no evidence of acute fracture or dislocation. Degenerative changes seen involving the intertarsal joints. Soft tissues are unremarkable. IMPRESSION: Degenerative changes as described above. No acute abnormality is noted. Electronically Signed   By: Marijo Conception M.D.   On: 08/11/2021 13:42   DG Tibia/Fibula Left  Result Date: 08/11/2021 CLINICAL DATA:  Left leg pain after fall. EXAM: LEFT TIBIA AND FIBULA - 2 VIEW COMPARISON:  None Available. FINDINGS: Status post surgical internal fixation of old distal left tibial and fibular fractures. Mildly angulated acute fracture is seen involving the midshaft of the left tibia. IMPRESSION: Acute mildly angulated fracture of midshaft of left tibia. Electronically Signed   By: Marijo Conception M.D.   On: 08/11/2021 13:39     Imaging discussed with Dr. Posey Pronto who also reviewed the images   PROCEDURES:  Critical Care performed: No  Procedures   MEDICATIONS ORDERED IN ED: Medications  fentaNYL (SUBLIMAZE) injection 50 mcg (50 mcg Intravenous Given 08/11/21 1349)     IMPRESSION / MDM / ASSESSMENT AND PLAN / ED COURSE  I reviewed the triage vital signs and the nursing notes.                              Differential diagnosis includes, but is not limited to, injury secondary to a fall.  She reports this to be mechanical in nature lost her balance due to her chronic weakness in the left leg.  Concern for contusion, musculoskeletal injury, fracture or bony injury etc. involving the left lower extremity.  Warm well-perfused no evidence of acute neurologic or vascular abnormality.  Difficulty with extension of the left knee is chronic and has history of previous multiple orthopedic surgeries and chronic deformity of the left ankle.    Patient's presentation is most consistent with acute complicated illness / injury requiring diagnostic workup.  The patient is on the cardiac monitor to evaluate for  evidence of arrhythmia and/or significant heart rate changes.  Clinical Course as of 08/11/21 1534  Mon Aug 11, 2021  1430 Discussed case with Dr. Leim Fabry of orthopedics.  He is reviewing imaging to develop plan of care at this time [MQ]  1430 Orthopedic consult has been requested [MQ]    Clinical Course User Index [MQ] Delman Kitten, MD   ----------------------------------------- 3:33 PM on 08/11/2021 ----------------------------------------- Plan consultation with orthopedics, Dr. Posey Pronto also discussed the case with the patient on the phone, decision made for nonoperative  treatment.  Placed into a long-leg posterior splint which is pending at this time.  Anticipate discharge thereafter.  Discussed pain control options and will trial hydrocodone.  Patient has a motorized wheelchair ramp and assistive devices already at her home, and family lives nearby.  She does not wish to be hospitalized but rather wishes for outpatient treatment  She continues to have normal neurologic and vascular function of the left lower extremity.  She has bruising over the anterior tibia, but no firm or tender compartments in the left lower leg.  No evidence of suggest acute developing compartment syndrome or a large or enlarging hematoma type formation.  She is very comfortable with plan for discharge and follow-up with [facility]opedics.  Return precautions and treatment recommendations and follow-up discussed with the patient who is agreeable with the plan.  Family taking her home, utilizing the patient's own wheelchair and her wheelchair Lucianne Lei  I will prescribe the patient a narcotic pain medicine due to their condition which I anticipate will cause at least moderate pain short term. I discussed with the patient safe use of narcotic pain medicines, and that they are not to drive, work in dangerous areas, or ever take more than prescribed (no more than 1 pill every 6 hours). We discussed that this is the type of  medication that can be  overdosed on and the risks of this type of medicine. Patient is very agreeable to only use as prescribed and to never use more than prescribed.   ----------------------------------------- 3:35 PM on 08/11/2021 ----------------------------------------- Ongoing care assigned to Dr. Leory Plowman, follow-up on reassessment after splint application.  Anticipate discharge if good splint application with intact neurovascular exam post splint  FINAL CLINICAL IMPRESSION(S) / ED DIAGNOSES   Final diagnoses:  Closed fracture of shaft of left tibia, unspecified fracture morphology, initial encounter     Rx / DC Orders   ED Discharge Orders          Ordered    HYDROcodone-acetaminophen (NORCO/VICODIN) 5-325 MG tablet  Every 6 hours PRN        08/11/21 1531    ondansetron (ZOFRAN-ODT) 4 MG disintegrating tablet  Every 6 hours PRN        08/11/21 1531             Note:  This document was prepared using Dragon voice recognition software and may include unintentional dictation errors.   Delman Kitten, MD 08/11/21 1535

## 2021-08-11 NOTE — ED Triage Notes (Signed)
Pt with hx of Polio tripped in kitchen PTA. Did not hit her head, not on blood thinners. Has had several surgeries on her LLE. LLE is swollen and tender below knee. + pulses marked, LLE warm to touch, able to move toes. EMS gave 50 mcg fentanyl with good relief.

## 2021-08-11 NOTE — Discharge Instructions (Addendum)
No weightbearing on the left leg.  Follow-up closely with [facility] orthopedics in about 1 week.  Please call to set up this appointment  Return to the ER right away if you have severe pain, numbness, cold or blue foot, difficulty moving the leg or feel that any other concerns arise.  No driving today or within 8 hours of use of hydrocodone.  Use hydrocodone only as prescribed.

## 2021-08-11 NOTE — ED Notes (Signed)
Splint placed by [employer], pt tolerated well. Family at bedside.

## 2021-08-12 ENCOUNTER — Emergency Department: Payer: Medicare Other

## 2021-08-12 ENCOUNTER — Encounter: Payer: Self-pay | Admitting: Emergency Medicine

## 2021-08-12 ENCOUNTER — Observation Stay
Admission: EM | Admit: 2021-08-12 | Discharge: 2021-08-14 | Disposition: A | Discharge status: Home / Self Care | Payer: Medicare Other | Attending: Emergency Medicine | Admitting: Emergency Medicine

## 2021-08-12 DIAGNOSIS — Z7982 Long term (current) use of aspirin: Secondary | ICD-10-CM | POA: Insufficient documentation

## 2021-08-12 DIAGNOSIS — M79672 Pain in left foot: Secondary | ICD-10-CM | POA: Diagnosis present

## 2021-08-12 DIAGNOSIS — R52 Pain, unspecified: Secondary | ICD-10-CM | POA: Diagnosis not present

## 2021-08-12 DIAGNOSIS — Y92000 Kitchen of unspecified non-institutional (private) residence as  the place of occurrence of the external cause: Secondary | ICD-10-CM | POA: Diagnosis not present

## 2021-08-12 DIAGNOSIS — S82209A Unspecified fracture of shaft of unspecified tibia, initial encounter for closed fracture: Secondary | ICD-10-CM | POA: Diagnosis present

## 2021-08-12 DIAGNOSIS — S82202D Unspecified fracture of shaft of left tibia, subsequent encounter for closed fracture with routine healing: Principal | ICD-10-CM | POA: Insufficient documentation

## 2021-08-12 DIAGNOSIS — J45909 Unspecified asthma, uncomplicated: Secondary | ICD-10-CM | POA: Insufficient documentation

## 2021-08-12 DIAGNOSIS — Z79899 Other long term (current) drug therapy: Secondary | ICD-10-CM | POA: Insufficient documentation

## 2021-08-12 DIAGNOSIS — Z6839 Body mass index (BMI) 39.0-39.9, adult: Secondary | ICD-10-CM | POA: Insufficient documentation

## 2021-08-12 DIAGNOSIS — W010XXD Fall on same level from slipping, tripping and stumbling without subsequent striking against object, subsequent encounter: Secondary | ICD-10-CM | POA: Insufficient documentation

## 2021-08-12 DIAGNOSIS — I10 Essential (primary) hypertension: Secondary | ICD-10-CM | POA: Insufficient documentation

## 2021-08-12 DIAGNOSIS — J449 Chronic obstructive pulmonary disease, unspecified: Secondary | ICD-10-CM | POA: Insufficient documentation

## 2021-08-12 DIAGNOSIS — E669 Obesity, unspecified: Secondary | ICD-10-CM | POA: Diagnosis not present

## 2021-08-12 DIAGNOSIS — E785 Hyperlipidemia, unspecified: Secondary | ICD-10-CM | POA: Diagnosis present

## 2021-08-12 MED ORDER — FENTANYL CITRATE PF 50 MCG/ML IJ SOSY
50.0000 ug | PREFILLED_SYRINGE | Freq: Once | INTRAMUSCULAR | Status: AC
  Administered 2021-08-12: 50 ug via INTRAMUSCULAR
  Filled 2021-08-12: qty 1

## 2021-08-12 NOTE — ED Provider Triage Note (Signed)
  Emergency Medicine Provider Triage Evaluation Note  Cheyenne Johnson , a 76 y.o.female,  was evaluated in triage.  Pt complains of pain in left lower extremity.  Reportedly had a fracture yesterday following a mechanical fall.  It was splinted and wrapped, however patient states that she has had increased pain in the affected extremity and believes that the wrap may be on too tight or in a bad position.  No other symptoms at this time.   Review of Systems  Positive: Left leg pain Negative: Denies fever, chest pain, vomiting  Physical Exam   Vitals:   08/12/21 1616  BP: (!) 144/51  Pulse: 88  Resp: 18  Temp: 98.3 F (36.8 C)  SpO2: 93%   Gen:   Awake, no distress   Resp:  Normal effort  MSK:   Moves extremities without difficulty  Other:    Medical Decision Making  Given the patient's initial medical screening exam, the following diagnostic evaluation has been ordered. The patient will be placed in the appropriate treatment space, once one is available, to complete the evaluation and treatment. I have discussed the plan of care with the patient and I have advised the patient that an ED physician or mid-level practitioner will reevaluate their condition after the test results have been received, as the results may give them additional insight into the type of treatment they may need.    Diagnostics: None immediately.  Treatments: none immediately   [place], [place], [place] 08/12/21 1630

## 2021-08-12 NOTE — ED Triage Notes (Signed)
Pt was seen yesterday for left leg fx. Pt has been taking prescribed meds but her heel is 10/10. Ortho to see her next Thursday.

## 2021-08-12 NOTE — H&P (Addendum)
History and Physical   [facility] [place] [facility] Admission History and Physical [employer], D.O.    Patient Name: Cheyenne Johnson MR#: [id number] Date of Birth: May 26, 1945 Date of Admission: 08/12/2021  Referring MD/NP/PA: Sherrie George Primary Care Physician: Adin Hector, MD  Chief Complaint:  Chief Complaint  Patient presents with   Leg Pain    HPI: Cheyenne Johnson is a 76 y.o. female with a known history of postpolio muscle weakness, asthma, COPD, hypertension, hyperlipidemia presents to the emergency department for evaluation of pain.  Patient was in a usual state of health until yesterday when she was standing in her kitchen, twisted and fell from a standing position onto her left leg sustaining a fracture of the midshaft of the left tibia.  At that time she was splinted and discharged home with orthopedic follow-up  on 08/21/21 She returns the emergency department tonight complaining of intractable pain Has been taking Norco as prescribed but reports 10 out of 10 pain.  She did receive fentanyl 50 in the emergency department tonight prior to having her splint readjusted by the emergency department PA.  She is now requesting admission for pain control  At baseline she has L>R LE weakness but is able to ambulate with cane at home but uses wheelchair when out in public.  Since the fall she requires assistance with transfers, daughters are staying with her at home.   Review of Systems:  CONSTITUTIONAL: No fever/chills, fatigue, weakness, weight gain/loss, headache. EYES: No blurry or double vision. ENT: No tinnitus, postnasal drip, redness or soreness of the oropharynx. RESPIRATORY: No cough, dyspnea, wheeze.  No hemoptysis.  CARDIOVASCULAR: No chest pain, palpitations, syncope, orthopnea. No lower extremity edema.  GASTROINTESTINAL: No nausea, vomiting, abdominal pain, diarrhea, constipation.  No hematemesis, melena or hematochezia. GENITOURINARY: No dysuria, frequency,  hematuria. ENDOCRINE: No polyuria or nocturia. No heat or cold intolerance. HEMATOLOGY: No anemia, bruising, bleeding. INTEGUMENTARY: No rashes, ulcers, lesions. MUSCULOSKELETAL: Positive left leg and heel pain. Chronic bilateral LE weakness.  no arthritis, gout. NEUROLOGIC: No numbness, tingling, ataxia, seizure-type activity, weakness. PSYCHIATRIC: No anxiety, depression, insomnia.   Past Medical History:  Diagnosis Date   Allergic rhinitis    Arthritis    Asthma    COPD (chronic obstructive pulmonary disease) (HCC)    Diverticulosis    Hepatitis    Hx of adenomatous colonic polyps    Hyperlipidemia    Hypertension    Osteoporosis, postmenopausal    Pneumonia    Poliomyelitis    Post-polio muscle weakness     Past Surgical History:  Procedure Laterality Date   ,REMOVED  1974   ABDOMINAL HYSTERECTOMY     BONE CURRETTAGE W/ BONE GRAFT     COLONOSCOPY     COLONOSCOPY WITH PROPOFOL N/A 03/27/2019   Procedure: COLONOSCOPY WITH PROPOFOL;  Surgeon: Toledo, Benay Pike, MD;  Location: [facility] ENDOSCOPY;  Service: Gastroenterology;  Laterality: N/A;   EYE SURGERY     FRACTURE SURGERY     ORIF OF TIBIAL FIBULAR FRACTURES   NASAL SINUS SURGERY       reports that she has never smoked. She has never used smokeless tobacco. She reports that she does not drink alcohol and does not use drugs.  No Known Allergies  Family History  Problem Relation Age of Onset   Breast cancer Neg Hx     Prior to Admission medications   Medication Sig Start Date End Date Taking? Authorizing Provider  albuterol (VENTOLIN HFA) 108 (90  Base) MCG/ACT inhaler Inhale 2 puffs into the lungs every 6 (six) hours as needed for wheezing or shortness of breath.    [provider]  amLODipine-benazepril (LOTREL) 5-10 MG capsule Take 1 capsule by mouth daily.    [provider]  aspirin EC 81 MG tablet Take 81 mg by mouth daily.    [provider]  Calcium-Phosphorus-Vitamin D (CITRACAL  +D3) 250-107-500 MG-MG-UNIT CHEW Chew 1 tablet by mouth daily.    [provider]  fluticasone (FLONASE) 50 MCG/ACT nasal spray Place 2 sprays into both nostrils daily.    [provider]  Fluticasone-Salmeterol (ADVAIR) 100-50 MCG/DOSE AEPB Inhale 1 puff into the lungs 2 (two) times daily.    [provider]  HYDROcodone-acetaminophen (NORCO/VICODIN) 5-325 MG tablet Take 1 tablet by mouth every 6 (six) hours as needed for moderate pain. 08/11/21   Delman Kitten, MD  levocetirizine (XYZAL) 5 MG tablet Take 5 mg by mouth every evening.    [provider]  lovastatin (MEVACOR) 20 MG tablet Take 20 mg by mouth at bedtime.    [provider]  montelukast (SINGULAIR) 10 MG tablet Take 10 mg by mouth at bedtime.    [provider]  ondansetron (ZOFRAN-ODT) 4 MG disintegrating tablet Take 1 tablet (4 mg total) by mouth every 6 (six) hours as needed for nausea or vomiting. 08/11/21   Delman Kitten, MD  Polyethyl Glycol-Propyl Glycol (SYSTANE ULTRA) 0.4-0.3 % SOLN Apply 1 drop to eye daily.    [provider]  triamcinolone cream (KENALOG) 0.1 % Spot treat affected areas 1-2 times daily as needed for itch.Avoid face, groin, underarms. 08/30/19   Brendolyn Patty, MD    Physical Exam: Vitals:   08/12/21 1616 08/12/21 1626  BP: (!) 144/51   Pulse: 88   Resp: 18   Temp: 98.3 F (36.8 C)   TempSrc: Oral   SpO2: 93%   Weight:  79 kg  Height:  '5\' 2"'$  (1.575 m)    GENERAL: 76 y.o.-year-old female patient, well-developed, well-nourished lying in the bed in no acute distress.  Pleasant and cooperative.   HEENT: Head atraumatic, normocephalic.  Mucus membranes moist. NECK: Supple. No JVD. CHEST: Normal breath sounds bilaterally. No wheezing, rales, rhonchi or crackles.  CARDIOVASCULAR: S1, S2 normal. No murmurs, rubs, or gallops. Cap refill <2 seconds. Pulses intact distally.  ABDOMEN: Soft, nondistended, nontender. No rebound, guarding, rigidity.   EXTREMITIES: No pedal edema, cyanosis, or clubbing.  Left lower extremity splinted NEUROLOGIC: The patient is alert and oriented x 3. Cranial nerves II through XII are grossly intact with no focal sensorimotor deficit. PSYCHIATRIC:  Normal affect, mood, thought content. SKIN: Warm, dry, and intact without obvious rash, lesion, or ulcer.    Labs on Admission: Radiological Exams on Admission: CT Foot Left Wo Contrast  Result Date: 08/12/2021 CLINICAL DATA:  Foot pain. Persistent posttraumatic injury yesterday. Worsening heel pain today. EXAM: CT OF THE LEFT FOOT WITHOUT CONTRAST TECHNIQUE: Multidetector CT imaging of the left foot was performed according to the standard protocol. Multiplanar CT image reconstructions were also generated. RADIATION DOSE REDUCTION: This exam was performed according to the departmental dose-optimization program which includes automated exposure control, adjustment of the mA and/or kV according to patient size and/or use of iterative reconstruction technique. COMPARISON:  None Available. FINDINGS: Bones/Joint/Cartilage Plate and screw fixation of the distal tibia and fibula with intact hardware. Diffuse osteopenia. Flattening of the foot with advanced arthritic changes of the tibiotalar joint. Osseous fusion of the subtalar  joint. Nonvisualization of the navicular bone suggesting talonavicular fusion or chronic avascular necrosis. Advanced arthritic changes of the intra tarsal and tarsometatarsal joints. No acute fracture or dislocation. Ligaments Suboptimally assessed by CT. Muscles and Tendons No fluid collection or abscess. Generalized muscle atrophy. Flexor and extensor tendons appear intact. Soft tissues Skin thickening and generalized soft tissue edema. No fluid collection or abscess. IMPRESSION: 1. Plate and screw fixation of the distal tibia and fibula. Hardware appears intact. Diffuse osteopenia limits evaluation. 2.  No evidence of acute fracture or dislocation. 3.  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 4. Arthrodesis of the subtalar and likely talonavicular joint or possibly osteonecrosis of the navicular bone. 5.  Moderate degenerative changes of the intertarsal joints. 6. Generalized muscle atrophy. Flexor and extensor tendons appear intact. 7. Moderate soft tissue swelling without evidence of fluid collection or hematoma. Electronically Signed   By: Keane Police D.O.   On: 08/12/2021 21:58   DG Foot Complete Left  Result Date: 08/11/2021 CLINICAL DATA:  Left foot pain after fall. EXAM: LEFT FOOT - COMPLETE 3+ VIEW COMPARISON:  None Available. FINDINGS: There is no evidence of acute fracture or dislocation. Degenerative changes seen involving the intertarsal joints. Soft tissues are unremarkable. IMPRESSION: Degenerative changes as described above. No acute abnormality is noted. Electronically Signed   By: Marijo Conception M.D.   On: 08/11/2021 13:42   DG Tibia/Fibula Left  Result Date: 08/11/2021 CLINICAL DATA:  Left leg pain after fall. EXAM: LEFT TIBIA AND FIBULA - 2 VIEW COMPARISON:  None Available. FINDINGS: Status post surgical internal fixation of old distal left tibial and fibular fractures. Mildly angulated acute fracture is seen involving the midshaft of the left tibia. IMPRESSION: Acute mildly angulated fracture of midshaft of left tibia. Electronically Signed   By: Marijo Conception M.D.   On: 08/11/2021 13:39      Assessment/Plan  This is a 76 y.o. female with a history of postpolio muscle weakness, asthma, COPD, hypertension, hyperlipidemia  now being admitted with:  #.  Intractable left lower extremity pain secondary to tibial fracture -Admit to observation - Pain control, request to avoid morphine due to hallucinations in the past.  - BMP pending to check GFR and if normal, will add ketorolac - PT evaluation for assistance with mobility and transfers.  -Patient has orthopedic follow-up with  08/21/2021  #.  History of hypertension  - Continue amlodipine, benazepril  #.  History of asthma/COPD - O2 and nebs as needed - Continue Flonase, Advair, Xyzal, Singulair  #. History of hyperlipidemia - Continue lovastatin  Admission status: Observation IV Fluids: Hep-Lock Diet/Nutrition: Heart healthy Consults called: PT DVT Px: Lovenox, SCDs and early ambulation. Code Status: Full Code  Disposition Plan: To home in less than 24 hours   All the records are reviewed and case discussed with ED provider. Management plans discussed with the patient and/or family who express understanding and agree with plan of care.  Cheyenne Johnson D.O. on 08/12/2021 at 10:30 PM CC: Primary care physician; Adin Hector, MD   08/12/2021, 10:30 PM

## 2021-08-12 NOTE — ED Provider Notes (Signed)
[facility] Provider Note    Event Date/Time   First MD Initiated Contact with Patient 08/12/21 1933     (approximate)   History   Leg Pain   HPI  Cheyenne Johnson is a 76 y.o. 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  She was evaluated here yesterday after sustaining a tibia fracture after fall.  OCL was applied prior to discharge.  She developed pain in her heel around 9 PM after she had been discharged home. Pain has continued despite taking pain medication.   Past Medical History:  Diagnosis Date   Allergic rhinitis    Arthritis    Asthma    COPD (chronic obstructive pulmonary disease) (HCC)    Diverticulosis    Hepatitis    Hx of adenomatous colonic polyps    Hyperlipidemia    Hypertension    Osteoporosis, postmenopausal    Pneumonia    Poliomyelitis    Post-polio muscle weakness      Physical Exam   Triage Vital Signs: ED Triage Vitals  Enc Vitals Group     BP 08/12/21 1616 (!) 144/51     Pulse Rate 08/12/21 1616 88     Resp 08/12/21 1616 18     Temp 08/12/21 1616 98.3 F (36.8 C)     Temp Source 08/12/21 1616 Oral     SpO2 08/12/21 1616 93 %     Weight 08/12/21 1626 174 lb 2.6 oz (79 kg)     Height 08/12/21 1626 '5\' 2"'$  (1.575 m)     Head Circumference --      Peak Flow --      Pain Score 08/12/21 1626 8     Pain Loc --      Pain Edu? --      Excl. in [place]? --     Most recent vital signs: Vitals:   08/12/21 1616 08/12/21 2243  BP: (!) 144/51 (!) 138/57  Pulse: 88 95  Resp: 18 16  Temp: 98.3 F (36.8 C) 98.4 F (36.9 C)  SpO2: 93% 94%    General: Awake, no distress.  CV:  Good peripheral perfusion.  Resp:  Normal effort.  Abd:  No distention.  Other:  Toes of left foot warm, dry, and normal in color and sensation.   ED Results / Procedures / Treatments   Labs (all labs ordered are  listed, but only abnormal results are displayed) Labs Reviewed  BASIC METABOLIC PANEL     EKG     RADIOLOGY  CT of the foot shows no acute findings.  I have independently reviewed and interpreted imaging as well as reviewed report from radiology.  PROCEDURES:  Critical Care performed: No  .Splint Application  Date/Time: 08/12/2021 11:36 PM  Performed by: Victorino Dike, FNP Authorized by: Victorino Dike, FNP   Consent:    Consent obtained:  Verbal   Consent given by:  Patient   Risks discussed:  Pain Universal protocol:    Procedure explained and questions answered to patient or proxy's satisfaction: yes     Imaging studies available: yes     Patient identity confirmed:  Verbally with patient Pre-procedure details:    Distal neurologic exam:  Normal   Distal perfusion: distal pulses strong   Procedure details:    Location:  Leg   Leg location:  L  lower leg   Cast type:  Short leg   Supplies:  Cotton padding and elastic bandage (OCL) Post-procedure details:    Distal neurologic exam:  Normal   Distal perfusion: distal pulses strong     Procedure completion:  Tolerated   Post-procedure imaging: not applicable      MEDICATIONS ORDERED IN ED:  Medications  fentaNYL (SUBLIMAZE) injection 50 mcg (50 mcg Intramuscular Given 08/12/21 2007)     IMPRESSION / MDM / ASSESSMENT AND PLAN / ED COURSE   I reviewed the triage vital signs and the nursing notes.  Differential diagnosis includes, but is not limited to: Calcaneus fracture, pain from splint, soft tissue injury  Patient's presentation is most consistent with acute presentation with potential threat to life or bodily function.  76 year old female with history of polio and chronic weakness in the lower extremities presents to the emergency department for evaluation of severe heel pain in the left foot after sustaining a fall and subsequent tibia fracture yesterday.  OCL was applied prior to discharge  yesterday and she developed pain in her heel few hours afterward.  I reviewed the images of the tibia and fibula as well as the image of the left foot from last night.  She does have a midshaft tibia fracture.  On exam, toes of the left foot are warm, dry, normal in color and sensation.  Plan discussed with the patient and family.  We will give pain medication then remove the splint that was applied last night.  Family feel that this may be the cause of the pain.  We also discussed getting a CT of the heel/foot to ensure that there is no fractures that were not identified on x-ray.  OCL reapplied by me and ER tech.  Due to patient's natural foot deformity, 2 separate pieces were used that leave the heel exposed but still support the foot and lower leg.  Again discussed CT image to ensure there is no fracture and the patient would like to proceed.  Heel pain has resolved after splint was removed.  Hell pain is much better, but pain in the leg has returned. CT of the foot is negative for acute concerns.  Results were discussed with the patient.  Patient and family are concerned that pain from the fracture will not be well treated with oral medication and would like to be admitted at least for observation overnight to ensure that oral medications will be sufficient.  They already have an appointment scheduled with orthopedics on Thursday.  Will discuss with hospitalist.  Patient accepted for admission.   FINAL CLINICAL IMPRESSION(S) / ED DIAGNOSES   Final diagnoses:  Intractable pain  Closed fracture of shaft of left tibia with routine healing, unspecified fracture morphology, subsequent encounter     Rx / DC Orders   ED Discharge Orders     None        Note:  This document was prepared using Dragon voice recognition software and may include unintentional dictation errors.   Victorino Dike, FNP 08/12/21 [id number]    Blake Divine, MD 08/13/21 [phone]

## 2021-08-13 ENCOUNTER — Encounter: Payer: Self-pay | Admitting: Family Medicine

## 2021-08-13 ENCOUNTER — Other Ambulatory Visit: Payer: Self-pay

## 2021-08-13 DIAGNOSIS — S82202D Unspecified fracture of shaft of left tibia, subsequent encounter for closed fracture with routine healing: Secondary | ICD-10-CM | POA: Diagnosis not present

## 2021-08-13 DIAGNOSIS — J449 Chronic obstructive pulmonary disease, unspecified: Secondary | ICD-10-CM | POA: Diagnosis not present

## 2021-08-13 DIAGNOSIS — E669 Obesity, unspecified: Secondary | ICD-10-CM | POA: Diagnosis present

## 2021-08-13 DIAGNOSIS — I10 Essential (primary) hypertension: Secondary | ICD-10-CM | POA: Diagnosis not present

## 2021-08-13 DIAGNOSIS — S82292D Other fracture of shaft of left tibia, subsequent encounter for closed fracture with routine healing: Secondary | ICD-10-CM | POA: Diagnosis not present

## 2021-08-13 DIAGNOSIS — E785 Hyperlipidemia, unspecified: Secondary | ICD-10-CM | POA: Diagnosis present

## 2021-08-13 LAB — COMPREHENSIVE METABOLIC PANEL
ALT: 16 U/L (ref 0–44)
AST: 21 U/L (ref 15–41)
Albumin: 3.3 g/dL — ABNORMAL LOW (ref 3.5–5.0)
Alkaline Phosphatase: 44 U/L (ref 38–126)
Anion gap: 4 — ABNORMAL LOW (ref 5–15)
BUN: 17 mg/dL (ref 8–23)
CO2: 25 mmol/L (ref 22–32)
Calcium: 8.6 mg/dL — ABNORMAL LOW (ref 8.9–10.3)
Chloride: 111 mmol/L (ref 98–111)
Creatinine, Ser: 0.51 mg/dL (ref 0.44–1.00)
GFR, Estimated: >60 mL/min (ref >60)
Glucose, Bld: 144 mg/dL — ABNORMAL HIGH (ref 70–99)
Potassium: 3.9 mmol/L (ref 3.5–5.1)
Sodium: 140 mmol/L (ref 135–145)
Total Bilirubin: 0.5 mg/dL (ref 0.3–1.2)
Total Protein: 6.5 g/dL (ref 6.5–8.1)

## 2021-08-13 LAB — BASIC METABOLIC PANEL
Anion gap: 8 (ref 5–15)
BUN: 16 mg/dL (ref 8–23)
CO2: 24 mmol/L (ref 22–32)
Calcium: 8.7 mg/dL — ABNORMAL LOW (ref 8.9–10.3)
Chloride: 109 mmol/L (ref 98–111)
Creatinine, Ser: 0.62 mg/dL (ref 0.44–1.00)
GFR, Estimated: >60 mL/min (ref >60)
Glucose, Bld: 145 mg/dL — ABNORMAL HIGH (ref 70–99)
Potassium: 4.3 mmol/L (ref 3.5–5.1)
Sodium: 141 mmol/L (ref 135–145)

## 2021-08-13 LAB — CBC
HCT: 36.4 % (ref 36.0–46.0)
Hemoglobin: 11.9 g/dL — ABNORMAL LOW (ref 12.0–15.0)
MCH: 30.4 pg (ref 26.0–34.0)
MCHC: 32.7 g/dL (ref 30.0–36.0)
MCV: 92.9 fL (ref 80.0–100.0)
Platelets: 228 10*3/uL (ref 150–400)
RBC: 3.92 MIL/uL (ref 3.87–5.11)
RDW: 14.8 % (ref 11.5–15.5)
WBC: 10.7 10*3/uL — ABNORMAL HIGH (ref 4.0–10.5)
nRBC: 0 % (ref 0.0–0.2)

## 2021-08-13 MED ORDER — IPRATROPIUM BROMIDE 0.02 % IN SOLN
0.5000 mg | Freq: Four times a day (QID) | RESPIRATORY_TRACT | Status: DC | PRN
Start: 2021-08-13 — End: 2021-08-13

## 2021-08-13 MED ORDER — ALBUTEROL SULFATE (2.5 MG/3ML) 0.083% IN NEBU
2.5000 mg | INHALATION_SOLUTION | Freq: Four times a day (QID) | RESPIRATORY_TRACT | Status: DC
  Filled 2021-08-13: qty 3

## 2021-08-13 MED ORDER — TRAZODONE HCL 50 MG PO TABS: 25.0000 mg | ORAL_TABLET | Freq: Every evening | ORAL | Status: DC | PRN

## 2021-08-13 MED ORDER — ONDANSETRON HCL 4 MG/2ML IJ SOLN: 4.0000 mg | Freq: Four times a day (QID) | INTRAMUSCULAR | Status: DC | PRN

## 2021-08-13 MED ORDER — SENNOSIDES-DOCUSATE SODIUM 8.6-50 MG PO TABS: 1.0000 | ORAL_TABLET | Freq: Every evening | ORAL | Status: DC | PRN

## 2021-08-13 MED ORDER — HYDROMORPHONE HCL 1 MG/ML IJ SOLN: 0.5000 mg | INTRAMUSCULAR | Status: DC | PRN

## 2021-08-13 MED ORDER — FLUTICASONE PROPIONATE 50 MCG/ACT NA SUSP
2.0000 | Freq: Every day | NASAL | Status: DC | PRN
  Filled 2021-08-13: qty 16

## 2021-08-13 MED ORDER — ASPIRIN 81 MG PO TBEC
81.0000 mg | DELAYED_RELEASE_TABLET | Freq: Every day | ORAL | Status: DC
  Administered 2021-08-13 – 2021-08-14 (×2): 81 mg via ORAL
  Filled 2021-08-13 (×2): qty 1

## 2021-08-13 MED ORDER — CETIRIZINE HCL 10 MG PO TABS
10.0000 mg | ORAL_TABLET | Freq: Every evening | ORAL | Status: DC
  Administered 2021-08-13: 10 mg via ORAL
  Filled 2021-08-13 (×2): qty 1

## 2021-08-13 MED ORDER — ONDANSETRON HCL 4 MG PO TABS: 4.0000 mg | ORAL_TABLET | Freq: Four times a day (QID) | ORAL | Status: DC | PRN

## 2021-08-13 MED ORDER — MONTELUKAST SODIUM 10 MG PO TABS
10.0000 mg | ORAL_TABLET | Freq: Every day | ORAL | Status: DC
  Administered 2021-08-13: 10 mg via ORAL
  Filled 2021-08-13: qty 1

## 2021-08-13 MED ORDER — ENOXAPARIN SODIUM 40 MG/0.4ML IJ SOSY
40.0000 mg | PREFILLED_SYRINGE | INTRAMUSCULAR | Status: DC
  Administered 2021-08-13 – 2021-08-14 (×2): 40 mg via SUBCUTANEOUS
  Filled 2021-08-13 (×2): qty 0.4

## 2021-08-13 MED ORDER — FLUTICASONE FUROATE-VILANTEROL 100-25 MCG/ACT IN AEPB
1.0000 | INHALATION_SPRAY | Freq: Every day | RESPIRATORY_TRACT | Status: DC
  Administered 2021-08-13 – 2021-08-14 (×2): 1 via RESPIRATORY_TRACT
  Filled 2021-08-13: qty 28

## 2021-08-13 MED ORDER — HYDROCODONE-ACETAMINOPHEN 5-325 MG PO TABS
2.0000 | ORAL_TABLET | Freq: Four times a day (QID) | ORAL | Status: DC
  Administered 2021-08-13 – 2021-08-14 (×7): 2 via ORAL
  Filled 2021-08-13 (×7): qty 2

## 2021-08-13 MED ORDER — AMLODIPINE BESYLATE 5 MG PO TABS
5.0000 mg | ORAL_TABLET | Freq: Every day | ORAL | Status: DC
  Administered 2021-08-13 – 2021-08-14 (×2): 5 mg via ORAL
  Filled 2021-08-13 (×2): qty 1

## 2021-08-13 MED ORDER — POLYVINYL ALCOHOL 1.4 % OP SOLN: 1.0000 [drp] | Freq: Every day | OPHTHALMIC | Status: DC | PRN

## 2021-08-13 MED ORDER — ACETAMINOPHEN 325 MG PO TABS: 650.0000 mg | ORAL_TABLET | Freq: Four times a day (QID) | ORAL | Status: DC | PRN

## 2021-08-13 MED ORDER — ACETAMINOPHEN 650 MG RE SUPP: 650.0000 mg | Freq: Four times a day (QID) | RECTAL | Status: DC | PRN

## 2021-08-13 MED ORDER — DOCUSATE SODIUM 100 MG PO CAPS
100.0000 mg | ORAL_CAPSULE | Freq: Two times a day (BID) | ORAL | Status: DC
  Administered 2021-08-13 – 2021-08-14 (×3): 100 mg via ORAL
  Filled 2021-08-13 (×3): qty 1

## 2021-08-13 MED ORDER — OYSTER SHELL CALCIUM/D3 500-5 MG-MCG PO TABS
1.0000 | ORAL_TABLET | Freq: Every day | ORAL | Status: DC
  Administered 2021-08-13 – 2021-08-14 (×2): 1 via ORAL
  Filled 2021-08-13 (×2): qty 1

## 2021-08-13 MED ORDER — ALBUTEROL SULFATE (2.5 MG/3ML) 0.083% IN NEBU: 2.5000 mg | INHALATION_SOLUTION | Freq: Four times a day (QID) | RESPIRATORY_TRACT | Status: DC | PRN

## 2021-08-13 MED ORDER — PRAVASTATIN SODIUM 20 MG PO TABS
20.0000 mg | ORAL_TABLET | Freq: Every day | ORAL | Status: DC
  Administered 2021-08-13: 20 mg via ORAL
  Filled 2021-08-13: qty 1

## 2021-08-13 MED ORDER — AMLODIPINE BESY-BENAZEPRIL HCL 5-10 MG PO CAPS: 1.0000 | ORAL_CAPSULE | Freq: Every day | ORAL | Status: DC

## 2021-08-13 MED ORDER — BISACODYL 5 MG PO TBEC: 5.0000 mg | DELAYED_RELEASE_TABLET | Freq: Every day | ORAL | Status: DC | PRN

## 2021-08-13 MED ORDER — BENAZEPRIL HCL 10 MG PO TABS
10.0000 mg | ORAL_TABLET | Freq: Every day | ORAL | Status: DC
  Administered 2021-08-13 – 2021-08-14 (×2): 10 mg via ORAL
  Filled 2021-08-13 (×2): qty 1

## 2021-08-13 MED ORDER — SODIUM CHLORIDE 0.9% FLUSH
3.0000 mL | Freq: Two times a day (BID) | INTRAVENOUS | Status: DC
  Administered 2021-08-13: 3 mL via INTRAVENOUS

## 2021-08-13 MED ORDER — HYDRALAZINE HCL 20 MG/ML IJ SOLN: 10.0000 mg | Freq: Four times a day (QID) | INTRAMUSCULAR | Status: DC | PRN

## 2021-08-13 NOTE — Assessment & Plan Note (Signed)
Meets criteria BMI greater than 30 

## 2021-08-13 NOTE — Evaluation (Signed)
Occupational Therapy Evaluation Patient Details Name: Cheyenne Johnson MRN: [id number] DOB: Apr 02, 1945 Today's Date: 08/13/2021   History of Present Illness Cheyenne Johnson is a 76 y.o. female with a known history of postpolio muscle weakness, asthma, COPD, hypertension, hyperlipidemia presents to the emergency department for evaluation of pain.  Patient was in a usual state of health until yesterday when she was standing in her kitchen, twisted and fell from a standing position onto her left leg sustaining a fracture of the midshaft of the left tibia.  At that time she was splinted and discharged home with orthopedic follow-up  on 08/21/21 She returns the emergency department tonight complaining of intractable pain Has been taking Norco as prescribed but reports 10 out of 10 pain.  She did receive fentanyl 50 in the emergency department tonight prior to having her splint readjusted by the emergency department PA.  She is now requesting admission for pain control   Clinical Impression   Pt was seen for OT evaluation this date. Prior to hospital admission, pt was needing some assistance with t/f and ADLs. Pt lives with children in one level house with ramped entrance. Pt presents to acute OT demonstrating impaired ADL performance and functional mobility 2/2 decreased activity tolerance and functional strength/ROM/balance deficits. Pt premedicated for session, 5/10 pain at start improving to 3/10 at end of session.   Pt currently requires MIN A for simulated BSC t/f, x2 needed to support L LE to maintain NWB pcns with min vcs for safe hand placement and sequencing. Anticipate MAX A for lower body dressing while seated. Pt left in bed with all needs met. Pt would benefit from skilled OT to address noted impairments and functional limitations (see below for any additional details). Upon hospital discharge, recommend [place] with assist for all mobility to maximize pt safety and return to PLOF - pending family transfer  training.      Recommendations for follow up therapy are one component of a multi-disciplinary discharge planning process, led by the attending physician.  Recommendations may be updated based on patient status, additional functional criteria and insurance authorization.   Follow Up Recommendations  Home health OT    Assistance Recommended at Discharge Frequent or constant Supervision/Assistance  Patient can return home with the following Two people to help with walking and/or transfers;A lot of help with bathing/dressing/bathroom;Assistance with cooking/housework;Direct supervision/assist for medications management;Assist for transportation;Help with stairs or ramp for entrance    Functional Status Assessment  Patient has had a recent decline in their functional status and demonstrates the ability to make significant improvements in function in a reasonable and predictable amount of time.  Equipment Recommendations  Other (comment) (BSC with drop arm)    Recommendations for Other Services       Precautions / Restrictions Precautions Precautions: Fall Required Braces or Orthoses: Splint/Cast Splint/Cast: L LE Restrictions Weight Bearing Restrictions: Yes LLE Weight Bearing: Non weight bearing      Mobility Bed Mobility Overal bed mobility: Needs Assistance Bed Mobility: Sit to Supine       Sit to supine: Min assist        Transfers Overall transfer level: Needs assistance Equipment used: None Transfers: Sit to/from Stand, Bed to chair/wheelchair/BSC Sit to Stand: Min assist, +2 safety/equipment (x2 to support LLE to maintain NWB pcns) Stand pivot transfers: Min assist, +2 safety/equipment (x2 to support LLE to maintain NWB pcns)                Balance Overall balance assessment:  Needs assistance Sitting-balance support: No upper extremity supported, Feet supported Sitting balance-Leahy Scale: Fair     Standing balance support: Bilateral upper extremity  supported Standing balance-Leahy Scale: Fair                             ADL either performed or assessed with clinical judgement   ADL Overall ADL's : Needs assistance/impaired                                       General ADL Comments: MIN A x2 to maintain NWB pcns for stand pivot simulated BSC t/f. Anticipate MAX A for lower body dressing while seated.      Pertinent Vitals/Pain Pain Assessment Pain Assessment: 0-10 Pain Score: 5  Pain Location: L LE with movement Pain Descriptors / Indicators: Discomfort, Grimacing, Guarding, Moaning     Hand Dominance     Extremity/Trunk Assessment Upper Extremity Assessment Upper Extremity Assessment: Overall WFL for tasks assessed   Lower Extremity Assessment Lower Extremity Assessment: Generalized weakness;LLE deficits/detail (L tibia fx)       Communication Communication Communication: No difficulties   Cognition Arousal/Alertness: Awake/alert Behavior During Therapy: WFL for tasks assessed/performed Overall Cognitive Status: Within Functional Limits for tasks assessed                                                  Home Living Family/patient expects to be discharged to:: Private residence Living Arrangements: Children Available Help at Discharge: Family;Available 24 hours/day Type of Home: House Home Access: Ramped entrance     Home Layout: One level               Home Equipment: Wheelchair - power;Rolling Walker (2 wheels);Cane - single point;BSC/3in1          Prior Functioning/Environment Prior Level of Function : Needs assist;History of Falls (last six months)             Mobility Comments: patient was requiring assistance from daughters prior to this admission. ADLs Comments: patient requiring assist from daughters prior to admission        OT Problem List: Decreased strength;Decreased range of motion;Decreased activity tolerance;Impaired  balance (sitting and/or standing);Decreased knowledge of use of DME or AE      OT Treatment/Interventions: Self-care/ADL training;Therapeutic exercise;Energy conservation;DME and/or AE instruction;Therapeutic activities;Patient/family education;Balance training    OT Goals(Current goals can be found in the care plan section) Acute Rehab OT Goals Patient Stated Goal: to go home OT Goal Formulation: With patient Time For Goal Achievement: 08/27/21 Potential to Achieve Goals: Fair ADL Goals Pt Will Perform Grooming: sitting;with set-up Pt Will Perform Lower Body Dressing: sitting/lateral leans;with min assist;with caregiver independent in assisting Pt Will Transfer to Toilet: stand pivot transfer;bedside commode;with min guard assist (with LRAD)  OT Frequency: Min 2X/week       AM-PAC OT "6 Clicks" Daily Activity     Outcome Measure Help from another person eating meals?: None Help from another person taking care of personal grooming?: A Little Help from another person toileting, which includes using toliet, bedpan, or urinal?: A Lot Help from another person bathing (including washing, rinsing, drying)?: A Lot Help from another person to put on and taking off   regular upper body clothing?: A Little Help from another person to put on and taking off regular lower body clothing?: A Lot 6 Click Score: 16   End of Session Equipment Utilized During Treatment: Gait belt  Activity Tolerance: Patient tolerated treatment well;Patient limited by pain Patient left: in bed;with call bell/phone within reach;with bed alarm set  OT Visit Diagnosis: Unsteadiness on feet (R26.81);Repeated falls (R29.6);Muscle weakness (generalized) (M62.81)                Time: 1348-1415 OT Time Calculation (min): 27 min Charges:  OT General Charges $OT Visit: 1 Visit OT Evaluation $OT Eval Moderate Complexity: 1 Mod OT Treatments $Self Care/Home Management : 8-22 mins  Alexis Abbott, OTDS  Alexis  Abbott 08/13/2021, 4:15 PM 

## 2021-08-13 NOTE — Assessment & Plan Note (Signed)
Continue statin. 

## 2021-08-13 NOTE — Assessment & Plan Note (Signed)
Stable.  Continue home medications. 

## 2021-08-13 NOTE — Plan of Care (Signed)
  Problem: Health Behavior/Discharge Planning: Goal: Ability to manage health-related needs will improve Outcome: Progressing   Problem: Clinical Measurements: Goal: Ability to maintain clinical measurements within normal limits will improve Outcome: Progressing   Problem: Clinical Measurements: Goal: Will remain free from infection Outcome: Progressing   Problem: Clinical Measurements: Goal: Diagnostic test results will improve Outcome: Progressing   Problem: Clinical Measurements: Goal: Respiratory complications will improve Outcome: Progressing   Problem: Clinical Measurements: Goal: Cardiovascular complication will be avoided Outcome: Progressing   Problem: Activity: Goal: Risk for activity intolerance will decrease Outcome: Progressing   Problem: Nutrition: Goal: Adequate nutrition will be maintained Outcome: Progressing   Problem: Coping: Goal: Level of anxiety will decrease Outcome: Progressing   Problem: Elimination: Goal: Will not experience complications related to bowel motility Outcome: Progressing   Problem: Elimination: Goal: Will not experience complications related to urinary retention Outcome: Progressing   Problem: Pain Managment: Goal: General experience of comfort will improve Outcome: Progressing   Problem: Safety: Goal: Ability to remain free from injury will improve Outcome: Progressing   Problem: Skin Integrity: Goal: Risk for impaired skin integrity will decrease Outcome: Progressing

## 2021-08-13 NOTE — Hospital Course (Signed)
76 year old female past medical history of postpolio muscle weakness, COPD and hypertension who initially presented to the emergency room on 7/31 after a fall landing on her left side and found to have a midshaft fracture of the left tibia.  Emergency department discussed with orthopedic surgery who recommended splinting and outpatient follow-up.  Over the next 24 hours, patient having severe pain and inability to stand and was brought in on 8/1 for further evaluation and pain control.  Patient initially insisting very much about going home with home health.  After discussion with her daughters, patient considered skilled nursing, however due to observation status, not a candidate for skilled nursing and was pain out-of-pocket.  Plan will be to discharge patient home on medication for pain, home health PT and OT plus RN, aide and social work.

## 2021-08-13 NOTE — Assessment & Plan Note (Signed)
Chronic Stable 

## 2021-08-13 NOTE — Assessment & Plan Note (Addendum)
Nonoperative.  Seen by physical therapy and patient's mobility significantly decreased.  Patient unable to get skilled nursing due to observation status.  Pain is better controlled so we will plan will be for patient to go home with home health PT and OT plus skilled nursing and aide

## 2021-08-13 NOTE — Plan of Care (Signed)
  Problem: Education: Goal: Knowledge of General Education information will improve Description: Including pain rating scale, medication(s)/side effects and non-pharmacologic comfort measures Outcome: Progressing   Problem: Health Behavior/Discharge Planning: Goal: Ability to manage health-related needs will improve Outcome: Progressing   Problem: Clinical Measurements: Goal: Ability to maintain clinical measurements within normal limits will improve Outcome: Progressing   Problem: Clinical Measurements: Goal: Diagnostic test results will improve Outcome: Progressing   Problem: Clinical Measurements: Goal: Respiratory complications will improve Outcome: Progressing   Problem: Clinical Measurements: Goal: Cardiovascular complication will be avoided Outcome: Progressing   Problem: Activity: Goal: Risk for activity intolerance will decrease Outcome: Progressing   Problem: Nutrition: Goal: Adequate nutrition will be maintained Outcome: Progressing   Problem: Coping: Goal: Level of anxiety will decrease Outcome: Progressing   Problem: Elimination: Goal: Will not experience complications related to bowel motility Outcome: Progressing   Problem: Elimination: Goal: Will not experience complications related to urinary retention Outcome: Progressing   Problem: Pain Managment: Goal: General experience of comfort will improve Outcome: Progressing   Problem: Safety: Goal: Ability to remain free from injury will improve Outcome: Progressing   Problem: Skin Integrity: Goal: Risk for impaired skin integrity will decrease Outcome: Progressing

## 2021-08-13 NOTE — Plan of Care (Signed)
  Problem: Clinical Measurements: Goal: Ability to maintain clinical measurements within normal limits will improve Outcome: Progressing   Problem: Education: Goal: Knowledge of General Education information will improve Description: Including pain rating scale, medication(s)/side effects and non-pharmacologic comfort measures Outcome: Progressing   Problem: Activity: Goal: Risk for activity intolerance will decrease Outcome: Progressing   Problem: Pain Managment: Goal: General experience of comfort will improve Outcome: Progressing

## 2021-08-13 NOTE — Evaluation (Signed)
Physical Therapy Evaluation Patient Details Name: Cheyenne Johnson MRN: [id number] DOB: November 21, 1945 Today's Date: 08/13/2021  History of Present Illness  Cheyenne Johnson is a 76 y.o. female with a known history of postpolio muscle weakness, asthma, COPD, hypertension, hyperlipidemia presents to the emergency department for evaluation of pain.  Patient was in a usual state of health until yesterday when she was standing in her kitchen, twisted and fell from a standing position onto her left leg sustaining a fracture of the midshaft of the left tibia.  At that time she was splinted and discharged home with orthopedic follow-up  on 08/21/21 She returns the emergency department tonight complaining of intractable pain Has been taking Norco as prescribed but reports 10 out of 10 pain.  She did receive fentanyl 50 in the emergency department tonight prior to having her splint readjusted by the emergency department PA.  She is now requesting admission for pain control.   Clinical Impression  Patient received in bed, she is agreeable to PT evaluation. She requires increased time and a lot of assist with all mobility due to L LE severe pain with movement. Patient was able to lateral scoot to recliner in sitting with one person holding her left leg (daughter) and the other (myself) assisting her to scoot over. Patient is pain limited. She will continue to benefit from skilled PT to improve functional mobility and independence.        Recommendations for follow up therapy are one component of a multi-disciplinary discharge planning process, led by the attending physician.  Recommendations may be updated based on patient status, additional functional criteria and insurance authorization.  Follow Up Recommendations Home health PT      Assistance Recommended at Discharge Frequent or constant Supervision/Assistance  Patient can return home with the following  A lot of help with walking and/or transfers;A lot of help with  bathing/dressing/bathroom;Help with stairs or ramp for entrance;Assistance with cooking/housework;Assist for transportation    Equipment Recommendations None recommended by PT  Recommendations for Other Services       Functional Status Assessment Patient has had a recent decline in their functional status and demonstrates the ability to make significant improvements in function in a reasonable and predictable amount of time.     Precautions / Restrictions Precautions Precautions: Fall Required Braces or Orthoses: Splint/Cast Splint/Cast: L LE Restrictions Weight Bearing Restrictions: Yes LLE Weight Bearing: Non weight bearing      Mobility  Bed Mobility Overal bed mobility: Needs Assistance Bed Mobility: Supine to Sit     Supine to sit: +2 for physical assistance, HOB elevated          Transfers Overall transfer level: Needs assistance   Transfers: Bed to chair/wheelchair/BSC            Lateral/Scoot Transfers: Max assist, +2 physical assistance      Ambulation/Gait               General Gait Details: patient unable to ambulate  Stairs            Wheelchair Mobility    Modified Rankin (Stroke Patients Only)       Balance Overall balance assessment: Modified Independent                                           Pertinent Vitals/Pain Pain Assessment Pain Assessment: 0-10 Pain Score: 7  Faces  Pain Scale: Hurts even more Pain Location: L LE with movement Pain Descriptors / Indicators: Discomfort, Grimacing, Guarding, Moaning Pain Intervention(s): Limited activity within patient's tolerance, Monitored during session, Premedicated before session, Repositioned    Home Living Family/patient expects to be discharged to:: Private residence Living Arrangements: Children Available Help at Discharge: Family;Available 24 hours/day Type of Home: House Home Access: Ramped entrance       Home Layout: One level Home  Equipment: Wheelchair - Engineer, manufacturing systems (2 wheels);Cane - single point;BSC/3in1      Prior Function Prior Level of Function : Needs assist;History of Falls (last six months)             Mobility Comments: patient was requiring assistance from daughters prior to this admission. ADLs Comments: patient requiring assist from daughters prior to admission     Hand Dominance        Extremity/Trunk Assessment   Upper Extremity Assessment Upper Extremity Assessment: Overall WFL for tasks assessed    Lower Extremity Assessment Lower Extremity Assessment: LLE deficits/detail LLE: Unable to fully assess due to pain LLE Coordination: decreased gross motor    Cervical / Trunk Assessment Cervical / Trunk Assessment: Normal  Communication   Communication: No difficulties  Cognition Arousal/Alertness: Awake/alert Behavior During Therapy: WFL for tasks assessed/performed Overall Cognitive Status: Within Functional Limits for tasks assessed                                          General Comments      Exercises     Assessment/Plan    PT Assessment Patient needs continued PT services  PT Problem List Decreased strength;Decreased mobility;Decreased activity tolerance;Pain;Decreased range of motion       PT Treatment Interventions DME instruction;Therapeutic exercise;Functional mobility training;Therapeutic activities;Patient/family education    PT Goals (Current goals can be found in the Care Plan section)  Acute Rehab PT Goals Patient Stated Goal: to go home with family assist PT Goal Formulation: With patient/family Time For Goal Achievement: 08/16/21 Potential to Achieve Goals: Good    Frequency Min 2X/week     Co-evaluation               AM-PAC PT "6 Clicks" Mobility  Outcome Measure Help needed turning from your back to your side while in a flat bed without using bedrails?: A Lot Help needed moving from lying on your back to sitting  on the side of a flat bed without using bedrails?: A Lot Help needed moving to and from a bed to a chair (including a wheelchair)?: A Lot Help needed standing up from a chair using your arms (e.g., wheelchair or bedside chair)?: Total Help needed to walk in hospital room?: Total Help needed climbing 3-5 steps with a railing? : Total 6 Click Score: 9    End of Session Equipment Utilized During Treatment: Gait belt Activity Tolerance: Patient limited by pain Patient left: in chair;with call bell/phone within reach;with family/visitor present Nurse Communication: Mobility status PT Visit Diagnosis: Other abnormalities of gait and mobility (R26.89);Pain;Difficulty in walking, not elsewhere classified (R26.2);History of falling (Z91.81) Pain - Right/Left: Left Pain - part of body: Leg    Time: 0102-7253 PT Time Calculation (min) (ACUTE ONLY): 37 min   Charges:   PT Evaluation $PT Eval Moderate Complexity: 1 Mod PT Treatments $Therapeutic Activity: 8-22 mins        Taylr Meuth, PT, GCS 08/13/21,11:37  AM

## 2021-08-13 NOTE — Progress Notes (Signed)
[facility] Progress Note  Patient: Cheyenne Johnson    BCW:[id number]  DOA: 08/12/2021    Date of Service: the patient was seen and examined on 08/13/2021  Brief hospital course: 76 year old female past medical history of postpolio muscle weakness, COPD and hypertension who initially presented to the emergency room on 7/31 after a fall landing on her left side and found to have a midshaft fracture of the left tibia.  Emergency department discussed with orthopedic surgery who recommended splinting and outpatient follow-up.  Over the next 24 hours, patient having severe pain and inability to stand and was brought in on 8/1 for further evaluation and pain control.  Patient initially insisting very much about going home with home health and so physical therapy recommended home health PT.  After discussion with patient and her daughters, patient is willing to consider short-term skilled nursing.  Assessment and Plan: Assessment and Plan: * Tibia fracture Nonoperative.  Seen by physical therapy and patient's mobility significantly decreased.  They recommended home health however this is from patient and daughter insisting on going home.  They have since reconsidered and are considering skilled nursing.  Pain better controlled.  Hypertension Stable.  Continue home medications.  COPD (chronic obstructive pulmonary disease) (HCC) Chronic.  Stable.  Hyperlipidemia Continue statin  Obesity (BMI 30-39.9) Meets criteria BMI greater than 30       Body mass index is 31.85 kg/m.        Consultants: None  Procedures: None  Antimicrobials: None  Code Status: Full code   Subjective: Pain tolerable when she is not moving.  Describes it as a 12 out of 10 with activity, even with pain medication Objective: Vital signs were reviewed and unremarkable. Vitals:   08/13/21 0811 08/13/21 1316  BP: (!) 132/59 (!) 141/50  Pulse: 73 65  Resp: 16 16  Temp: 98.3 F (36.8 C) 98.6 F (37 C)   SpO2: 95% 96%   No intake or output data in the 24 hours ending 08/13/21 1555 Filed Weights   08/12/21 1626  Weight: 79 kg   Body mass index is 31.85 kg/m.  Exam:  General: Alert and oriented x3, no acute distress HEENT: Normocephalic and atraumatic, mucous membranes are moist Cardiovascular: Regular rate and rhythm, S1-S2 Respiratory: Decreased breath sounds throughout Abdomen: Soft, nontender, nondistended, positive bowel sound Musculoskeletal: No clubbing or cyanosis or edema Skin: No skin breaks, tears or lesions Psychiatry: Appropriate, no evidence of psychoses Neurology: No focal deficits  Data Reviewed: Labs from 8/2 unremarkable  Disposition:  Status is: Observation     Anticipated discharge date: 8/3  Remaining issues to be resolved so that patient can be discharged: Potential skilled nursing facility admission   Family Communication: Daughter at bedside DVT Prophylaxis: enoxaparin (LOVENOX) injection 40 mg Start: 08/13/21 0800 SCDs Start: 08/13/21 0388    Author: Annita Brod ,MD 08/13/2021 3:55 PM  To reach On-call, see care teams to locate the attending and reach out via [email or web address]. Between 7PM-7AM, please contact night-coverage If you still have difficulty reaching the attending provider, please page the [facility] (Director on Call) for [facility] on amion for assistance.

## 2021-08-14 DIAGNOSIS — E785 Hyperlipidemia, unspecified: Secondary | ICD-10-CM | POA: Diagnosis not present

## 2021-08-14 DIAGNOSIS — E669 Obesity, unspecified: Secondary | ICD-10-CM | POA: Diagnosis not present

## 2021-08-14 DIAGNOSIS — S82202D Unspecified fracture of shaft of left tibia, subsequent encounter for closed fracture with routine healing: Secondary | ICD-10-CM | POA: Diagnosis not present

## 2021-08-14 DIAGNOSIS — I10 Essential (primary) hypertension: Secondary | ICD-10-CM | POA: Diagnosis not present

## 2021-08-14 MED ORDER — SENNOSIDES-DOCUSATE SODIUM 8.6-50 MG PO TABS
1.0000 | ORAL_TABLET | Freq: Every evening | ORAL | 0 refills | Status: AC | PRN
Start: 2021-08-14 — End: ?

## 2021-08-14 MED ORDER — OXYCODONE HCL 5 MG PO TABS: 5.0000 mg | ORAL_TABLET | Freq: Four times a day (QID) | ORAL | 0 refills | Status: AC | PRN

## 2021-08-14 NOTE — Progress Notes (Addendum)
Met with the patient and her daughter at the bedside She lives alone but her daughter will be staying with her She has a WC and a standard walker She will need a drop arm 3 in 1 and a RW to be delivered to the bedside by adapt She is accepted by [facility] services Her daughter provides transportation Reviewed the [facility] covering Observation status

## 2021-08-14 NOTE — Progress Notes (Signed)
Occupational Therapy Treatment Patient Details Name: Cheyenne Johnson MRN: [id number] DOB: 1945/07/08 Today's Date: 08/14/2021   History of present illness Cheyenne Johnson is a 76 y.o. female with a known history of postpolio muscle weakness, asthma, COPD, hypertension, hyperlipidemia presents to the emergency department for evaluation of pain.  Patient was in a usual state of health until yesterday when she was standing in her kitchen, twisted and fell from a standing position onto her left leg sustaining a fracture of the midshaft of the left tibia.  At that time she was splinted and discharged home with orthopedic follow-up  on 08/21/21 She returns the emergency department tonight complaining of intractable pain Has been taking Norco as prescribed but reports 10 out of 10 pain.  She did receive fentanyl 50 in the emergency department tonight prior to having her splint readjusted by the emergency department PA.  She is now requesting admission for pain control   OT comments  Pt seen for OT treatment on this date. Upon arrival to room pt awake and alert, lying in bed with HOB elevated and family and nursing present. Pt reporting 5/10 pain throughout session. Pt agreeable to tx focused on family training on safe ADL t/f. Pt required SUPERVISION for bed mobility with caregiver providing support for L LE. SUPERVISION for stand pivot t/f x2 (bed>BSC and BSC>chair) + RW with min vcs for technique and caregiver assisting to maintain NWB pcns. Pt trialed transfer board, but feels more comfortable with stand pivot t/f using the RW. MIN A for donning/doffing underpants and SUPERVISION for pericare while seated.   Pt and family educated on equipment [facility] with drop arm) and safe ADL participation. Pt and family verbalized understanding and return demonstrated of instruction provided. Pt left in bed with all needs met. All questions from family and pt answered within scope of practice. Pt making good progress toward goals. Pt  continues to benefit from skilled OT services to maximize return to PLOF and minimize risk of future falls, injury. Will continue to follow POC. Discharge recommendation remains appropriate.     Recommendations for follow up therapy are one component of a multi-disciplinary discharge planning process, led by the attending physician.  Recommendations may be updated based on patient status, additional functional criteria and insurance authorization.    Follow Up Recommendations  Home health OT    Assistance Recommended at Discharge Frequent or constant Supervision/Assistance  Patient can return home with the following  A lot of help with bathing/dressing/bathroom;Assistance with cooking/housework;Direct supervision/assist for medications management;Assist for transportation;Help with stairs or ramp for entrance;A lot of help with walking and/or transfers   Equipment Recommendations  Other (comment) (BSC with drop arm)    Recommendations for Other Services      Precautions / Restrictions Precautions Precautions: Fall Required Braces or Orthoses: Splint/Cast Splint/Cast: L LE Restrictions Weight Bearing Restrictions: Yes LLE Weight Bearing: Non weight bearing       Mobility Bed Mobility Overal bed mobility: Needs Assistance Bed Mobility: Supine to Sit     Supine to sit: Supervision, HOB elevated (caregiver asssiting to maintain NWB pcns)          Transfers Overall transfer level: Needs assistance Equipment used: Rolling walker (2 wheels) Transfers: Sit to/from Stand, Bed to chair/wheelchair/BSC Sit to Stand: Supervision Stand pivot transfers: Supervision               Balance Overall balance assessment: Modified Independent  ADL either performed or assessed with clinical judgement   ADL Overall ADL's : Needs assistance/impaired                                       General ADL Comments:  SUPERVISION with caregiver assisting to maintain NWB pcns for bed mobility and stand pivot BSC t/f. MIN A for donning/doffing underpants while seated.      Cognition Arousal/Alertness: Awake/alert Behavior During Therapy: WFL for tasks assessed/performed Overall Cognitive Status: Within Functional Limits for tasks assessed                                                     Pertinent Vitals/ Pain       Pain Assessment Pain Assessment: 0-10 Pain Score: 5  Pain Location: L LE with movement Pain Descriptors / Indicators: Discomfort, Grimacing Pain Intervention(s): Limited activity within patient's tolerance, Monitored during session, Repositioned, Premedicated before session, Ice applied   Frequency  Min 2X/week        Progress Toward Goals  OT Goals(current goals can now be found in the care plan section)  Progress towards OT goals: Progressing toward goals  Acute Rehab OT Goals Patient Stated Goal: to go home OT Goal Formulation: With patient Time For Goal Achievement: 08/27/21 Potential to Achieve Goals: Good ADL Goals Pt Will Perform Grooming: sitting;with set-up Pt Will Perform Lower Body Dressing: sitting/lateral leans;with min assist;with caregiver independent in assisting Pt Will Transfer to Toilet: stand pivot transfer;bedside commode;with min guard assist  Plan Discharge plan remains appropriate;Frequency remains appropriate       AM-PAC OT "6 Clicks" Daily Activity     Outcome Measure   Help from another person eating meals?: None Help from another person taking care of personal grooming?: None Help from another person toileting, which includes using toliet, bedpan, or urinal?: A Little Help from another person bathing (including washing, rinsing, drying)?: A Lot Help from another person to put on and taking off regular upper body clothing?: None Help from another person to put on and taking off regular lower body clothing?: A Lot 6  Click Score: 19    End of Session Equipment Utilized During Treatment: Gait belt;Rolling walker (2 wheels)  OT Visit Diagnosis: Unsteadiness on feet (R26.81);Repeated falls (R29.6);Muscle weakness (generalized) (M62.81)   Activity Tolerance Patient tolerated treatment well   Patient Left in bed;with call bell/phone within reach;with bed alarm set;with family/visitor present   Nurse Communication          Time: 6122-4497 OT Time Calculation (min): 42 min  Charges: OT General Charges $OT Visit: 1 Visit OT Treatments $Self Care/Home Management : 38-52 mins  [employer] OTDS  [employer] 08/14/2021, 12:35 PM

## 2021-08-14 NOTE — Progress Notes (Cosign Needed)
Patient is not able to walk the distance required to go the bathroom, or he/she is unable to safely negotiate stairs required to access the bathroom.  A 3in1 BSC will alleviate this problem  

## 2021-08-14 NOTE — Discharge Summary (Signed)
Physician Discharge Summary   Patient: Cheyenne Johnson MRN: [id number] DOB: 08-22-1945  Admit date:     08/12/2021  Discharge date: 08/14/21  Discharge Physician: Annita Brod   PCP: Adin Hector, MD   Recommendations at discharge:   Medication change: Patient had been previously on Percocet.  Changed to Oxy IR 5 mg every 6 hours and to take an additional dose before physical therapy Patient going home with home health PT, OT, RN, aide and social work  Discharge Diagnoses: Principal Problem:   Tibia fracture Active Problems:   Hypertension   COPD (chronic obstructive pulmonary disease) (HCC)   Hyperlipidemia   Obesity (BMI 30-39.9)  Resolved Problems:   * No resolved hospital problems. *  Hospital Course: 76 year old female past medical history of postpolio muscle weakness, COPD and hypertension who initially presented to the emergency room on 7/31 after a fall landing on her left side and found to have a midshaft fracture of the left tibia.  Emergency department discussed with orthopedic surgery who recommended splinting and outpatient follow-up.  Over the next 24 hours, patient having severe pain and inability to stand and was brought in on 8/1 for further evaluation and pain control.  Patient initially insisting very much about going home with home health.  After discussion with her daughters, patient considered skilled nursing, however due to observation status, not a candidate for skilled nursing and was pain out-of-pocket.  Plan will be to discharge patient home on medication for pain, home health PT and OT plus RN, aide and social work.  Assessment and Plan: * Tibia fracture Nonoperative.  Seen by physical therapy and patient's mobility significantly decreased.  Patient unable to get skilled nursing due to observation status.  Pain is better controlled so we will plan will be for patient to go home with home health PT and OT plus skilled nursing and  aide  Hypertension Stable.  Continue home medications.  COPD (chronic obstructive pulmonary disease) (HCC) Chronic.  Stable.  Hyperlipidemia Continue statin  Obesity (BMI 30-39.9) Meets criteria BMI greater than 30        Pain control - [place] Controlled Substance Reporting System database was reviewed. and patient was instructed, not to drive, operate heavy machinery, perform activities at heights, swimming or participation in water activities or provide baby-sitting services while on Pain, Sleep and Anxiety Medications; until their outpatient Physician has advised to do so again. Also recommended to not to take more than prescribed Pain, Sleep and Anxiety Medications.  Consultants: None Procedures performed: None Disposition: Home Diet recommendation:  Discharge Diet Orders (From admission, onward)     Start     Ordered   08/14/21 0000  Diet - low sodium heart healthy        08/14/21 1300           Cardiac diet DISCHARGE MEDICATION: Allergies as of 08/14/2021   No Known Allergies      Medication List     STOP taking these medications    HYDROcodone-acetaminophen 5-325 MG tablet Commonly known as: NORCO/VICODIN       TAKE these medications    Advair Diskus 250-50 MCG/ACT Aepb Generic drug: fluticasone-salmeterol Inhale 1 puff into the lungs 2 (two) times daily.   albuterol 108 (90 Base) MCG/ACT inhaler Commonly known as: VENTOLIN HFA Inhale 2 puffs into the lungs every 6 (six) hours as needed for wheezing or shortness of breath.   alendronate 70 MG tablet Commonly known as: FOSAMAX Take 70  mg by mouth once a week.   amLODipine-benazepril 5-10 MG capsule Commonly known as: LOTREL Take 1 capsule by mouth daily.   aspirin EC 81 MG tablet Take 81 mg by mouth daily.   Citracal +D3 250-107-500 MG-MG-UNIT Chew Generic drug: Calcium-Phosphorus-Vitamin D Chew 1 tablet by mouth daily.   fluticasone 50 MCG/ACT nasal spray Commonly known as:  FLONASE Place 2 sprays into both nostrils daily.   levocetirizine 5 MG tablet Commonly known as: XYZAL Take 5 mg by mouth every evening.   lovastatin 20 MG tablet Commonly known as: MEVACOR Take 20 mg by mouth at bedtime.   montelukast 10 MG tablet Commonly known as: SINGULAIR Take 10 mg by mouth at bedtime.   ondansetron 4 MG disintegrating tablet Commonly known as: ZOFRAN-ODT Take 1 tablet (4 mg total) by mouth every 6 (six) hours as needed for nausea or vomiting.   OPTIVE 0.5-0.9 % ophthalmic solution Generic drug: carboxymethylcellul-glycerin Place 1 drop into both eyes as needed for dry eyes.   oxyCODONE 5 MG immediate release tablet Commonly known as: Roxicodone Take 1 tablet (5 mg total) by mouth every 6 (six) hours as needed (Take additional dose before physical therapy).   senna-docusate 8.6-50 MG tablet Commonly known as: Senokot-S Take 1 tablet by mouth at bedtime as needed for mild constipation.   Systane Ultra 0.4-0.3 % Soln Generic drug: Polyethyl Glycol-Propyl Glycol Apply 1 drop to eye daily.   Vitamin D-1000 Max St 25 MCG (1000 UT) tablet Generic drug: Cholecalciferol Take 1,000 Units by mouth daily.               Durable Medical Equipment  (From admission, onward)           Start     Ordered   08/14/21 1302  For home use only DME 3 n 1  Once        08/14/21 1301   08/14/21 1302  For home use only DME Walker rolling  Once       Question Answer Comment  Walker: With 5 Inch Wheels   Patient needs a walker to treat with the following condition Femur fracture ([place])      08/14/21 1301   08/14/21 0824  For home use only DME 3 n 1  Once       Comments: Drop Arm [facility]   08/14/21 0823   08/14/21 0824  For home use only DME Walker rolling  Once       Question Answer Comment  Walker: With [place] Wheels   Patient needs a walker to treat with the following condition Generalized weakness      08/14/21 0823            Discharge  Exam: Danley Danker Weights   08/12/21 1626  Weight: 79 kg   General: Alert and oriented x3, no acute distress Cardiovascular: Regular rate and rhythm, S1-S2 Lungs: Clear auscultation bilaterally  Condition at discharge: fair  The results of significant diagnostics from this hospitalization (including imaging, microbiology, ancillary and laboratory) are listed below for reference.   Imaging Studies: CT Foot Left Wo Contrast  Result Date: 08/12/2021 CLINICAL DATA:  Foot pain. Persistent posttraumatic injury yesterday. Worsening heel pain today. EXAM: CT OF THE LEFT FOOT WITHOUT CONTRAST TECHNIQUE: Multidetector CT imaging of the left foot was performed according to the standard protocol. Multiplanar CT image reconstructions were also generated. RADIATION DOSE REDUCTION: This exam was performed according to the departmental dose-optimization program which includes automated exposure control, adjustment of  the mA and/or kV according to patient size and/or use of iterative reconstruction technique. COMPARISON:  None Available. FINDINGS: Bones/Joint/Cartilage Plate and screw fixation of the distal tibia and fibula with intact hardware. Diffuse osteopenia. Flattening of the foot with advanced arthritic changes of the tibiotalar joint. Osseous fusion of the subtalar joint. Nonvisualization of the navicular bone suggesting talonavicular fusion or chronic avascular necrosis. Advanced arthritic changes of the intra tarsal and tarsometatarsal joints. No acute fracture or dislocation. Ligaments Suboptimally assessed by CT. Muscles and Tendons No fluid collection or abscess. Generalized muscle atrophy. Flexor and extensor tendons appear intact. Soft tissues Skin thickening and generalized soft tissue edema. No fluid collection or abscess. IMPRESSION: 1. Plate and screw fixation of the distal tibia and fibula. Hardware appears intact. Diffuse osteopenia limits evaluation. 2.  No evidence of acute fracture or dislocation.  3. 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 4. Arthrodesis of the subtalar and likely talonavicular joint or possibly osteonecrosis of the navicular bone. 5.  Moderate degenerative changes of the intertarsal joints. 6. Generalized muscle atrophy. Flexor and extensor tendons appear intact. 7. Moderate soft tissue swelling without evidence of fluid collection or hematoma. Electronically Signed   By: Keane Police D.O.   On: 08/12/2021 21:58   DG Foot Complete Left  Result Date: 08/11/2021 CLINICAL DATA:  Left foot pain after fall. EXAM: LEFT FOOT - COMPLETE 3+ VIEW COMPARISON:  None Available. FINDINGS: There is no evidence of acute fracture or dislocation. Degenerative changes seen involving the intertarsal joints. Soft tissues are unremarkable. IMPRESSION: Degenerative changes as described above. No acute abnormality is noted. Electronically Signed   By: Marijo Conception M.D.   On: 08/11/2021 13:42   DG Tibia/Fibula Left  Result Date: 08/11/2021 CLINICAL DATA:  Left leg pain after fall. EXAM: LEFT TIBIA AND FIBULA - 2 VIEW COMPARISON:  None Available. FINDINGS: Status post surgical internal fixation of old distal left tibial and fibular fractures. Mildly angulated acute fracture is seen involving the midshaft of the left tibia. IMPRESSION: Acute mildly angulated fracture of midshaft of left tibia. Electronically Signed   By: Marijo Conception M.D.   On: 08/11/2021 13:39    Microbiology: Results for orders placed or performed during the hospital encounter of 03/23/19  SARS CORONAVIRUS 2 (TAT 6-24 HRS) Nasopharyngeal Nasopharyngeal Swab     Status: None   Collection Time: 03/23/19 12:58 PM   Specimen: Nasopharyngeal Swab  Result Value Ref Range Status   SARS Coronavirus 2 NEGATIVE NEGATIVE Final    Comment: (NOTE) SARS-CoV-2 target nucleic acids are NOT DETECTED. The SARS-CoV-2 RNA is generally detectable in upper and lower respiratory specimens during the acute phase  of infection. Negative results do not preclude SARS-CoV-2 infection, do not rule out co-infections with other pathogens, and should not be used as the sole basis for treatment or other patient management decisions. Negative results must be combined with clinical observations, patient history, and epidemiological information. The expected result is Negative. Fact Sheet for Patients: SugarRoll.be Fact Sheet for Healthcare Providers: https://www.woods-mathews.com/ This test is not yet approved or cleared by the [place] FDA and  has been authorized for detection and/or diagnosis of SARS-CoV-2 by FDA under an Emergency Use Authorization (EUA). This EUA will remain  in effect (meaning this test can be used) for the duration of the COVID-19 declaration under Section 56 4(b)(1) of the Act, 21 U.S.C. section 360bbb-3(b)(1), unless the authorization is terminated or revoked sooner. Performed at [facility],  [address] [place], [place] [address]     Labs: CBC: Recent Labs  Lab 08/13/21 0407  WBC 10.7*  HGB 11.9*  HCT 36.4  MCV 92.9  PLT 225   Basic Metabolic Panel: Recent Labs  Lab 08/13/21 0045 08/13/21 0407  NA 141 140  K 4.3 3.9  CL 109 111  CO2 24 25  GLUCOSE 145* 144*  BUN 16 17  CREATININE 0.62 0.51  CALCIUM 8.7* 8.6*   Liver Function Tests: Recent Labs  Lab 08/13/21 0407  AST 21  ALT 16  ALKPHOS 44  BILITOT 0.5  PROT 6.5  ALBUMIN 3.3*   CBG: No results for input(s): "GLUCAP" in the last 168 hours.  Discharge time spent: less than 30 minutes.  Signed: Annita Brod, MD [facility] 08/14/2021

## 2021-10-30 ENCOUNTER — Other Ambulatory Visit: Payer: Self-pay | Admitting: Internal Medicine

## 2021-10-30 DIAGNOSIS — R1314 Dysphagia, pharyngoesophageal phase: Secondary | ICD-10-CM

## 2021-11-13 ENCOUNTER — Ambulatory Visit
Admission: RE | Admit: 2021-11-13 | Discharge: 2021-11-13 | Disposition: A | Discharge status: Home / Self Care | Payer: Medicare Other | Source: Ambulatory Visit | Attending: Internal Medicine | Admitting: Internal Medicine

## 2021-11-13 DIAGNOSIS — R1314 Dysphagia, pharyngoesophageal phase: Secondary | ICD-10-CM | POA: Diagnosis present

## 2022-02-09 ENCOUNTER — Other Ambulatory Visit: Payer: Self-pay | Admitting: Internal Medicine

## 2022-02-09 DIAGNOSIS — Z1231 Encounter for screening mammogram for malignant neoplasm of breast: Secondary | ICD-10-CM

## 2022-03-05 ENCOUNTER — Ambulatory Visit
Admission: RE | Admit: 2022-03-05 | Discharge: 2022-03-05 | Disposition: A | Discharge status: Home / Self Care | Payer: Medicare Other | Source: Ambulatory Visit | Attending: Internal Medicine | Admitting: Internal Medicine

## 2022-03-05 DIAGNOSIS — Z1231 Encounter for screening mammogram for malignant neoplasm of breast: Secondary | ICD-10-CM | POA: Diagnosis not present

## 2023-01-18 ENCOUNTER — Ambulatory Visit: Payer: Medicare Other | Admitting: Dermatology

## 2023-02-03 ENCOUNTER — Ambulatory Visit: Payer: Medicare Other | Admitting: Dermatology

## 2023-02-10 ENCOUNTER — Encounter: Payer: Self-pay | Admitting: Dermatology

## 2023-02-10 ENCOUNTER — Ambulatory Visit: Payer: Medicare Other | Admitting: Dermatology

## 2023-02-10 DIAGNOSIS — L821 Other seborrheic keratosis: Secondary | ICD-10-CM | POA: Diagnosis not present

## 2023-02-10 DIAGNOSIS — W098XXA Fall on or from other playground equipment, initial encounter: Secondary | ICD-10-CM | POA: Diagnosis not present

## 2023-02-10 DIAGNOSIS — L578 Other skin changes due to chronic exposure to nonionizing radiation: Secondary | ICD-10-CM

## 2023-02-10 DIAGNOSIS — L570 Actinic keratosis: Secondary | ICD-10-CM

## 2023-02-10 DIAGNOSIS — L820 Inflamed seborrheic keratosis: Secondary | ICD-10-CM

## 2023-02-10 NOTE — Progress Notes (Signed)
Follow-Up Visit   Subjective  Cheyenne Johnson is a 78 y.o. female who presents for the following: check spot R index finger 37m, painful, check spot nose, 12m, hx of drainage The patient has spots, moles and lesions to be evaluated, some may be new or changing and the patient may have concern these could be cancer.   The following portions of the chart were reviewed this encounter and updated as appropriate: medications, allergies, medical history  Review of Systems:  No other skin or systemic complaints except as noted in HPI or Assessment and Plan.  Objective  Well appearing patient in no apparent distress; mood and affect are within normal limits.   A focused examination was performed of the following areas: Face, right hand  Relevant exam findings are noted in the Assessment and Plan.  R dorsal index finger x 1 Pink scaly macule with keratotic rim nasal dorsum x 1 Stuck on waxy tan papule with erythema  Assessment & Plan     AK (ACTINIC KERATOSIS) R dorsal index finger x 1 AK vs Porokeratosis, recheck on f/up  Actinic keratoses are precancerous spots that appear secondary to cumulative UV radiation exposure/sun exposure over time. They are chronic with expected duration over 1 year. A portion of actinic keratoses will progress to squamous cell carcinoma of the skin. It is not possible to reliably predict which spots will progress to skin cancer and so treatment is recommended to prevent development of skin cancer.  Recommend daily broad spectrum sunscreen SPF 30+ to sun-exposed areas, reapply every 2 hours as needed.  Recommend staying in the shade or wearing long sleeves, sun glasses (UVA+UVB protection) and wide brim hats (4-inch brim around the entire circumference of the hat). Call for new or changing lesions. Destruction of lesion - R dorsal index finger x 1  Destruction method: cryotherapy   Informed consent: discussed and consent obtained   Lesion destroyed using  liquid nitrogen: Yes   Region frozen until ice ball extended beyond lesion: Yes   Outcome: patient tolerated procedure well with no complications   Post-procedure details: wound care instructions given   Additional details:  Prior to procedure, discussed risks of blister formation, small wound, skin dyspigmentation, or rare scar following cryotherapy. Recommend Vaseline ointment to treated areas while healing.  INFLAMED SEBORRHEIC KERATOSIS nasal dorsum x 1 Symptomatic, irritating, patient would like treated. Destruction of lesion - nasal dorsum x 1  Destruction method: cryotherapy   Informed consent: discussed and consent obtained   Lesion destroyed using liquid nitrogen: Yes   Region frozen until ice ball extended beyond lesion: Yes   Outcome: patient tolerated procedure well with no complications   Post-procedure details: wound care instructions given   Additional details:  Prior to procedure, discussed risks of blister formation, small wound, skin dyspigmentation, or rare scar following cryotherapy. Recommend Vaseline ointment to treated areas while healing.   SEBORRHEIC KERATOSIS face - Stuck-on, waxy, tan-brown papules and/or plaques  - Benign-appearing - Discussed benign etiology and prognosis. - Observe - Call for any changes  ACTINIC DAMAGE face - chronic, secondary to cumulative UV radiation exposure/sun exposure over time - diffuse scaly erythematous macules with underlying dyspigmentation - Recommend daily broad spectrum sunscreen SPF 30+ to sun-exposed areas, reapply every 2 hours as needed.  - Recommend staying in the shade or wearing long sleeves, sun glasses (UVA+UVB protection) and wide brim hats (4-inch brim around the entire circumference of the hat). - Call for new or changing lesions.  Return if symptoms worsen or fail to improve.  I, Ardis Rowan, RMA, am acting as scribe for Willeen Niece, MD .   Documentation: I have reviewed the above documentation for  accuracy and completeness, and I agree with the above.  Willeen Niece, MD

## 2023-02-10 NOTE — Patient Instructions (Addendum)
Cryotherapy Aftercare  Wash gently with soap and water everyday.   Apply Vaseline and Band-Aid daily until healed.    Due to recent changes in healthcare laws, you may see results of your pathology and/or laboratory studies on MyChart before the doctors have had a chance to review them. We understand that in some cases there may be results that are confusing or concerning to you. Please understand that not all results are received at the same time and often the doctors may need to interpret multiple results in order to provide you with the best plan of care or course of treatment. Therefore, we ask that you please give [place] 2 business days to thoroughly review all your results before contacting the office for clarification. Should we see a critical lab result, you will be contacted sooner.   If You Need Anything After Your Visit  If you have any questions or concerns for your doctor, please call our main line at [phone] and press option 4 to reach your doctor's medical assistant. If no one answers, please leave a voicemail as directed and we will return your call as soon as possible. Messages left after 4 pm will be answered the following business day.   You may also send [place] a message via MyChart. We typically respond to MyChart messages within 1-2 business days.  For prescription refills, please ask your pharmacy to contact our office. Our fax number is [phone].  If you have an urgent issue when the clinic is closed that cannot wait until the next business day, you can page your doctor at the number below.    Please note that while we do our best to be available for urgent issues outside of office hours, we are not available 24/7.   If you have an urgent issue and are unable to reach [place], you may choose to seek medical care at your doctor's office, retail clinic, urgent care center, or emergency room.  If you have a medical emergency, please immediately call 911 or go to the emergency  department.  Pager Numbers  - Dr. Gwen Pounds: [phone]  - Dr. Roseanne Reno: [phone]  - Dr. Katrinka Blazing: [phone]   In the event of inclement weather, please call our main line at [phone] for an update on the status of any delays or closures.  Dermatology Medication Tips: Please keep the boxes that topical medications come in in order to help keep track of the instructions about where and how to use these. Pharmacies typically print the medication instructions only on the boxes and not directly on the medication tubes.   If your medication is too expensive, please contact our office at [phone] option 4 or send [place] a message through MyChart.   We are unable to tell what your co-pay for medications will be in advance as this is different depending on your insurance coverage. However, we may be able to find a substitute medication at lower cost or fill out paperwork to get insurance to cover a needed medication.   If a prior authorization is required to get your medication covered by your insurance company, please allow [place] 1-2 business days to complete this process.  Drug prices often vary depending on where the prescription is filled and some pharmacies may offer cheaper prices.  The website www.goodrx.com contains coupons for medications through different pharmacies. The prices here do not account for what the cost may be with help from insurance (it may be cheaper with your insurance), but the website can  give you the price if you did not use any insurance.  - You can print the associated coupon and take it with your prescription to the pharmacy.  - You may also stop by our office during regular business hours and pick up a GoodRx coupon card.  - If you need your prescription sent electronically to a different pharmacy, notify our office through [facility] or by phone at [phone] option 4.     Si Usted Necesita Algo Despus de Su Visita  Tambin puede enviarnos  un mensaje a travs de Clinical cytogeneticist. Por lo general respondemos a los mensajes de MyChart en el transcurso de 1 a 2 das hbiles.  Para renovar recetas, por favor pida a su farmacia que se ponga en contacto con nuestra oficina. Annie Sable de fax es [place] [phone].  Si tiene un asunto urgente cuando la clnica est cerrada y que no puede esperar hasta el siguiente da hbil, puede llamar/localizar a su doctor(a) al nmero que aparece a continuacin.   Por favor, tenga en cuenta que aunque hacemos todo lo posible para estar disponibles para asuntos urgentes fuera del horario de [place], no estamos disponibles las 24 horas del da, los 7 [address]  [address] de la [place].   Si tiene un problema urgente y no puede comunicarse con nosotros, puede optar por buscar atencin mdica  en el consultorio de su doctor(a), en una clnica privada, en un centro de atencin urgente o en una sala de emergencias.  Si tiene Engineer, drilling, por favor llame inmediatamente al 911 o vaya a la sala de emergencias.  Nmeros de bper  - Dr. Gwen Pounds: [phone]  - Dra. Roseanne Reno: [phone]  - Dr. Katrinka Blazing: [phone]   En caso de inclemencias del tiempo, por favor llame a Lacy Duverney principal al [phone] para una actualizacin sobre el [place] de cualquier retraso o cierre.  Consejos para la medicacin en dermatologa: Por favor, guarde las cajas en las que vienen los medicamentos de uso tpico para ayudarle a seguir las instrucciones sobre dnde y cmo usarlos. Las farmacias generalmente imprimen las instrucciones del medicamento slo en las cajas y no directamente en los tubos del [place].   Si su medicamento es muy caro, por favor, pngase en contacto con Rolm Gala llamando al [phone] y presione la opcin 4 o envenos un mensaje a travs de Clinical cytogeneticist.   No podemos decirle cul ser su copago por los medicamentos por adelantado ya que esto es diferente dependiendo de la cobertura de su seguro. Sin  embargo, es posible que podamos encontrar un medicamento sustituto a Audiological scientist un formulario para que el seguro cubra el medicamento que se considera necesario.   Si se requiere una autorizacin previa para que su compaa de seguros [place] su medicamento, por favor permtanos de 1 a 2 das hbiles para completar [address]  Los precios de los medicamentos varan con frecuencia dependiendo del Environmental consultant de dnde se surte la receta y alguna farmacias pueden ofrecer precios ms baratos.  El sitio web www.goodrx.com tiene cupones para medicamentos de Health and safety inspector. Los precios aqu no tienen en cuenta lo que podra costar con la ayuda del seguro (puede ser ms barato con su seguro), pero el sitio web puede darle el precio si no utiliz Tourist information centre manager.  - Puede imprimir el cupn correspondiente y llevarlo con su receta a la farmacia.  - Tambin puede pasar por nuestra oficina durante el horario de atencin regular y Education officer, museum una tarjeta de cupones de GoodRx.  -  Si necesita que su receta se enve electrnicamente a Psychiatrist, informe a nuestra oficina a travs de MyChart de [place] o por telfono llamando al [phone] y presione la opcin 4.

## 2023-03-01 ENCOUNTER — Other Ambulatory Visit: Payer: Self-pay | Admitting: Internal Medicine

## 2023-03-01 DIAGNOSIS — Z1231 Encounter for screening mammogram for malignant neoplasm of breast: Secondary | ICD-10-CM

## 2023-03-10 ENCOUNTER — Ambulatory Visit
Admission: RE | Admit: 2023-03-10 | Discharge: 2023-03-10 | Disposition: A | Discharge status: Home / Self Care | Payer: Medicare Other | Source: Ambulatory Visit | Attending: Internal Medicine | Admitting: Internal Medicine

## 2023-03-10 DIAGNOSIS — Z1231 Encounter for screening mammogram for malignant neoplasm of breast: Secondary | ICD-10-CM | POA: Diagnosis present

## 2023-09-05 IMAGING — MG MM DIGITAL SCREENING BILAT W/ TOMO AND CAD
6 of 10 series · 6 of 30 positions shown · non-contrast
Comparison: Previous exam(s).

CLINICAL DATA: Screening.

EXAM:
DIGITAL SCREENING BILATERAL MAMMOGRAM WITH TOMOSYNTHESIS AND CAD
TECHNIQUE: Bilateral screening digital craniocaudal and mediolateral oblique
mammograms were obtained. Bilateral screening digital breast
tomosynthesis was performed. The images were evaluated with
computer-aided detection.

[L MLO synth-2D (1 of 2)]
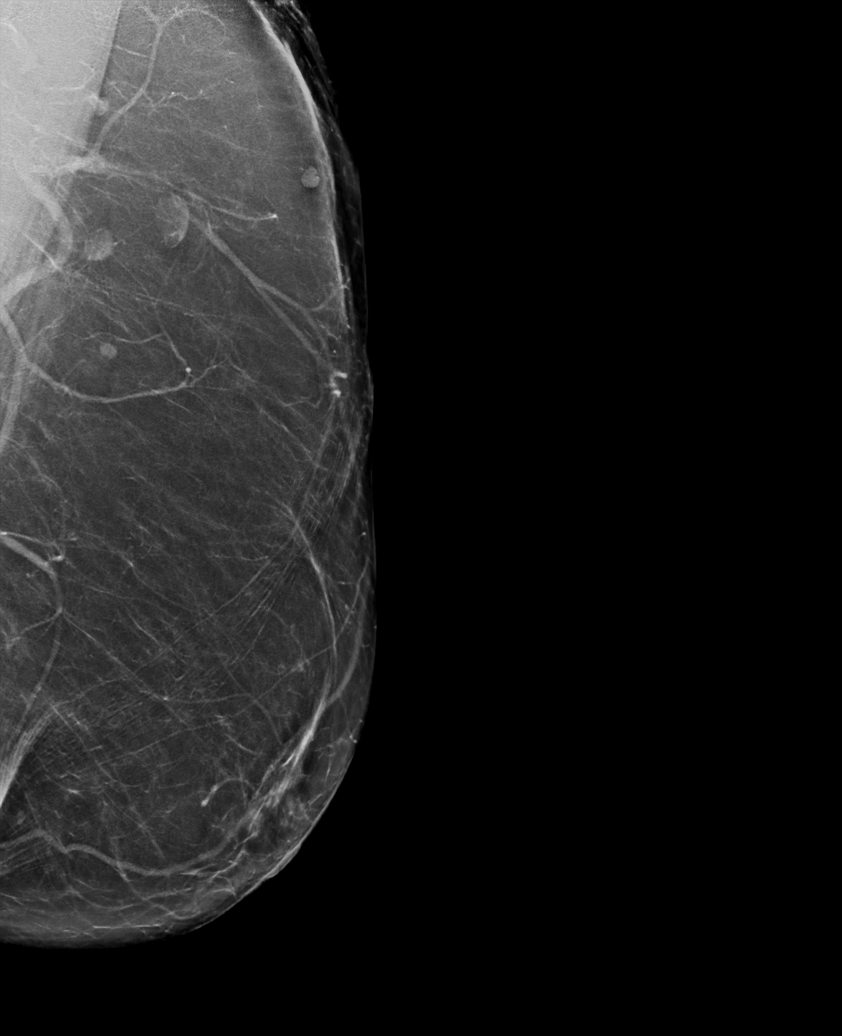

[L MLO synth-2D (2 of 2)]
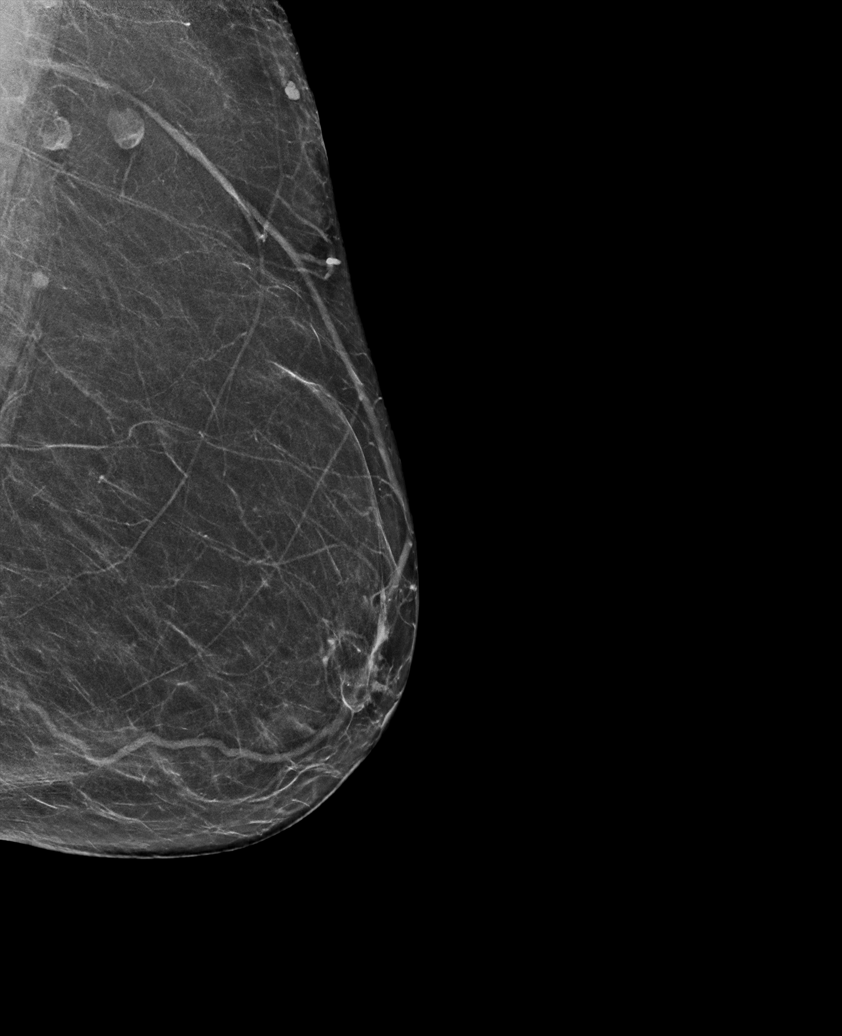

[R MLO synth-2D]
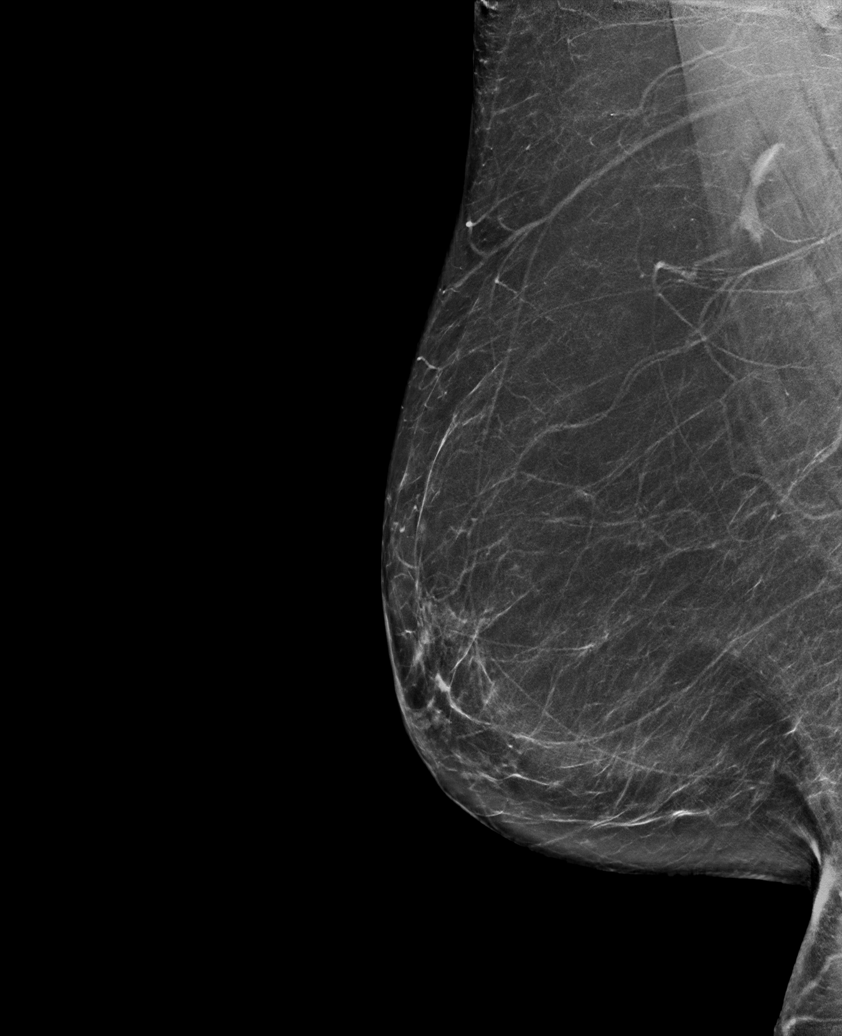

[R CC synth-2D]
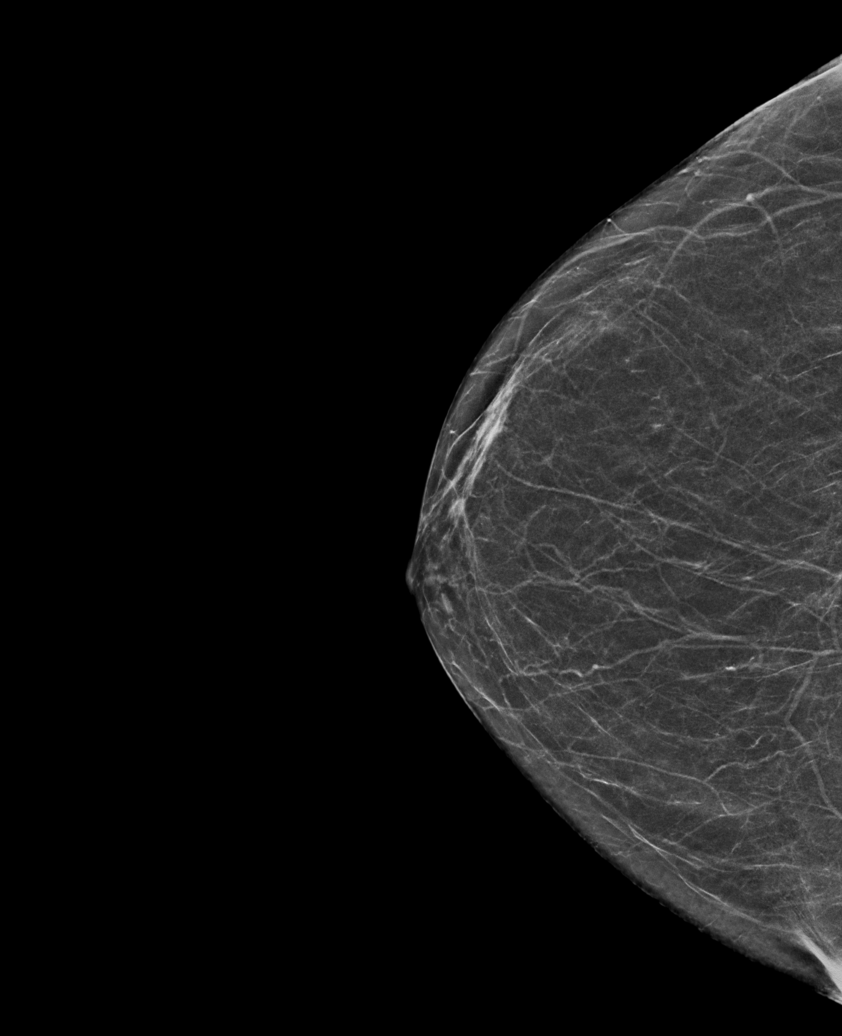

[L CC synth-2D]
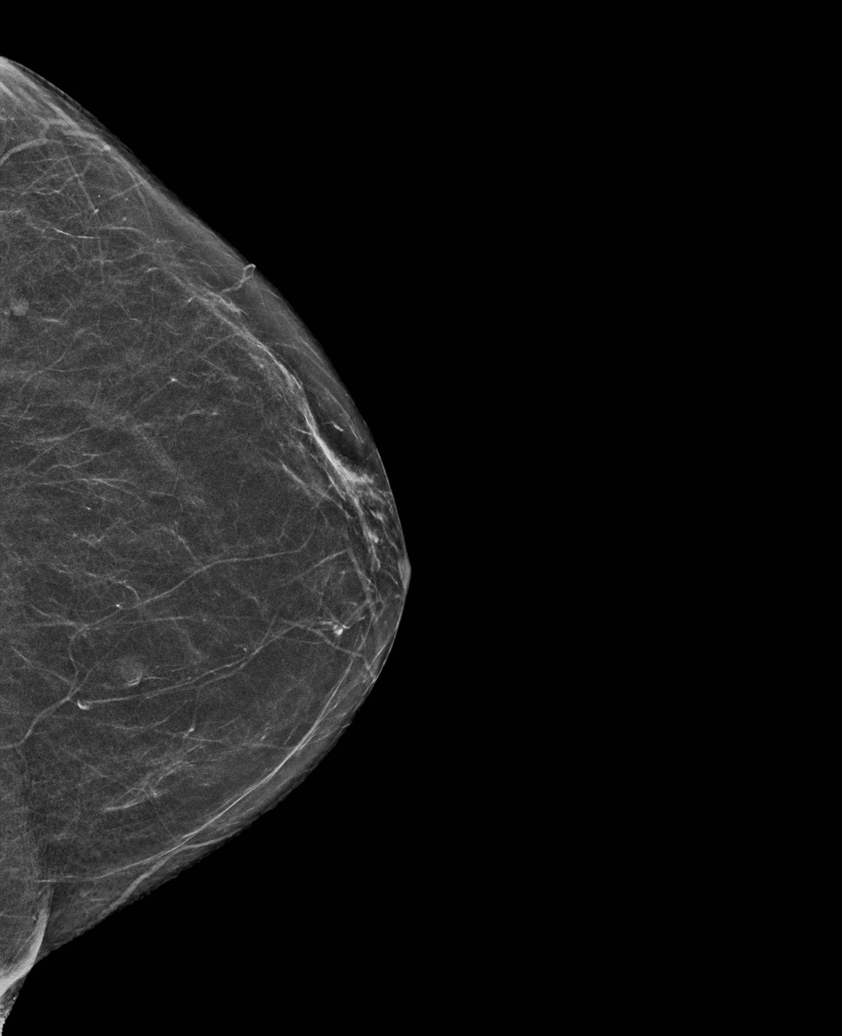

[L CC tomo · tomo slice 27/54.0]
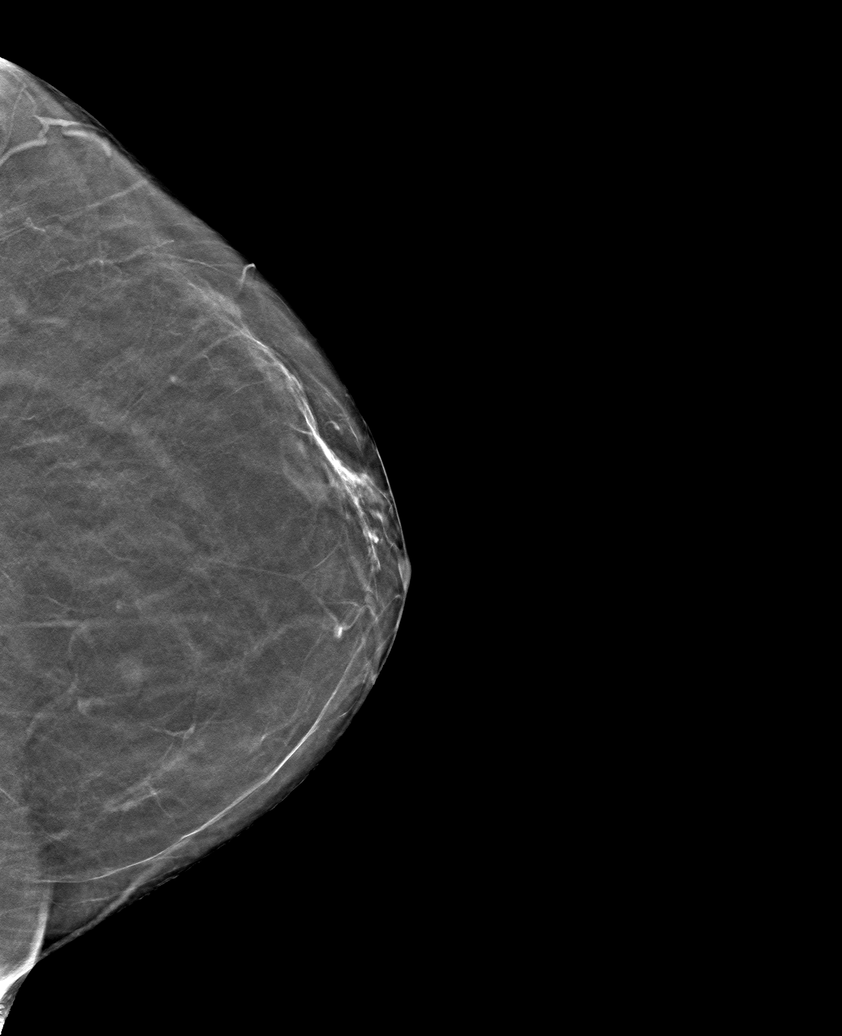

[6 of 30 positions shown; findings below may reference images not displayed]

ACR Breast Density Category b: There are scattered areas of
fibroglandular density.
FINDINGS: There are no findings suspicious for malignancy.
IMPRESSION: No mammographic evidence of malignancy. A result letter of this
screening mammogram will be mailed directly to the patient.

RECOMMENDATION:
Screening mammogram in one year. (Code:51-O-LD2)

BI-RADS CATEGORY  1: Negative.
